# Patient Record
Sex: Male | Born: 1965 | Race: White | Hispanic: No | Marital: Married | Smoking: Never smoker
Health system: Southern US, Community
[De-identification: ages and names within clinical notes are randomized; demographics above are authoritative.]

## PROBLEM LIST (undated history)

## (undated) DIAGNOSIS — F902 Attention-deficit hyperactivity disorder, combined type: Secondary | ICD-10-CM

## (undated) DIAGNOSIS — M9250 Juvenile osteochondrosis of tibia and fibula, unspecified leg: Secondary | ICD-10-CM

## (undated) DIAGNOSIS — E7800 Pure hypercholesterolemia, unspecified: Secondary | ICD-10-CM

## (undated) DIAGNOSIS — R739 Hyperglycemia, unspecified: Secondary | ICD-10-CM

## (undated) DIAGNOSIS — I10 Essential (primary) hypertension: Secondary | ICD-10-CM

## (undated) DIAGNOSIS — E785 Hyperlipidemia, unspecified: Secondary | ICD-10-CM

## (undated) DIAGNOSIS — C4491 Basal cell carcinoma of skin, unspecified: Secondary | ICD-10-CM

## (undated) DIAGNOSIS — Z85828 Personal history of other malignant neoplasm of skin: Secondary | ICD-10-CM

## (undated) DIAGNOSIS — R079 Chest pain, unspecified: Secondary | ICD-10-CM

## (undated) DIAGNOSIS — M25519 Pain in unspecified shoulder: Secondary | ICD-10-CM

## (undated) DIAGNOSIS — K1379 Other lesions of oral mucosa: Secondary | ICD-10-CM

## (undated) DIAGNOSIS — S0993XA Unspecified injury of face, initial encounter: Secondary | ICD-10-CM

## (undated) DIAGNOSIS — M7918 Myalgia, other site: Secondary | ICD-10-CM

## (undated) DIAGNOSIS — G8929 Other chronic pain: Secondary | ICD-10-CM

## (undated) DIAGNOSIS — K219 Gastro-esophageal reflux disease without esophagitis: Secondary | ICD-10-CM

## (undated) DIAGNOSIS — IMO0002 Reserved for concepts with insufficient information to code with codable children: Secondary | ICD-10-CM

## (undated) DIAGNOSIS — M92529 Juvenile osteochondrosis of tibia tubercle, unspecified leg: Secondary | ICD-10-CM

## (undated) DIAGNOSIS — R229 Localized swelling, mass and lump, unspecified: Secondary | ICD-10-CM

## (undated) DIAGNOSIS — S6720XA Crushing injury of unspecified hand, initial encounter: Secondary | ICD-10-CM

## (undated) DIAGNOSIS — K625 Hemorrhage of anus and rectum: Secondary | ICD-10-CM

## (undated) DIAGNOSIS — J45909 Unspecified asthma, uncomplicated: Secondary | ICD-10-CM

## (undated) DIAGNOSIS — R51 Headache: Secondary | ICD-10-CM

## (undated) DIAGNOSIS — R519 Headache, unspecified: Secondary | ICD-10-CM

## (undated) DIAGNOSIS — G894 Chronic pain syndrome: Secondary | ICD-10-CM

## (undated) HISTORY — DX: Hyperglycemia, unspecified: R73.9

## (undated) HISTORY — DX: Juvenile osteochondrosis of tibia and fibula, unspecified leg: M92.50

## (undated) HISTORY — DX: Crushing injury of unspecified hand, initial encounter: S67.20XA

## (undated) HISTORY — DX: Myalgia, other site: M79.18

## (undated) HISTORY — DX: Basal cell carcinoma of skin, unspecified: C44.91

## (undated) HISTORY — DX: Other lesions of oral mucosa: K13.79

## (undated) HISTORY — DX: Chest pain, unspecified: R07.9

## (undated) HISTORY — PX: TONSILLECTOMY: SHX5217

## (undated) HISTORY — DX: Hemorrhage of anus and rectum: K62.5

## (undated) HISTORY — DX: Essential (primary) hypertension: I10

## (undated) HISTORY — DX: Attention-deficit hyperactivity disorder, combined type: F90.2

## (undated) HISTORY — DX: Localized swelling, mass and lump, unspecified: R22.9

## (undated) HISTORY — DX: Headache, unspecified: R51.9

## (undated) HISTORY — DX: Other chronic pain: G89.29

## (undated) HISTORY — DX: Unspecified injury of face, initial encounter: S09.93XA

## (undated) HISTORY — DX: Gastro-esophageal reflux disease without esophagitis: K21.9

## (undated) HISTORY — DX: Pain in unspecified shoulder: M25.519

## (undated) HISTORY — DX: Juvenile osteochondrosis of tibia tubercle, unspecified leg: M92.529

## (undated) HISTORY — DX: Pure hypercholesterolemia, unspecified: E78.00

## (undated) HISTORY — DX: Headache: R51

## (undated) HISTORY — DX: Unspecified asthma, uncomplicated: J45.909

## (undated) HISTORY — DX: Personal history of other malignant neoplasm of skin: Z85.828

## (undated) HISTORY — DX: Hyperlipidemia, unspecified: E78.5

## (undated) HISTORY — DX: Reserved for concepts with insufficient information to code with codable children: IMO0002

## (undated) HISTORY — DX: Chronic pain syndrome: G89.4

---

## 2002-02-01 DIAGNOSIS — C4491 Basal cell carcinoma of skin, unspecified: Secondary | ICD-10-CM

## 2002-02-01 HISTORY — PX: BASAL CELL CARCINOMA EXCISION: SHX1214

## 2002-02-01 HISTORY — DX: Basal cell carcinoma of skin, unspecified: C44.91

## 2004-01-17 ENCOUNTER — Ambulatory Visit (HOSPITAL_COMMUNITY): Admission: RE | Admit: 2004-01-17 | Discharge: 2004-01-17 | Payer: Self-pay | Admitting: Neurosurgery

## 2004-02-05 ENCOUNTER — Ambulatory Visit (HOSPITAL_COMMUNITY): Admission: RE | Admit: 2004-02-05 | Discharge: 2004-02-05 | Payer: Self-pay | Admitting: *Deleted

## 2005-05-02 HISTORY — PX: WRIST SURGERY: SHX841

## 2005-05-19 ENCOUNTER — Encounter (INDEPENDENT_AMBULATORY_CARE_PROVIDER_SITE_OTHER): Payer: Self-pay | Admitting: *Deleted

## 2005-05-19 ENCOUNTER — Ambulatory Visit (HOSPITAL_BASED_OUTPATIENT_CLINIC_OR_DEPARTMENT_OTHER): Admission: RE | Admit: 2005-05-19 | Discharge: 2005-05-19 | Payer: Self-pay | Admitting: Orthopedic Surgery

## 2005-09-13 ENCOUNTER — Encounter: Admission: RE | Admit: 2005-09-13 | Discharge: 2005-09-13 | Payer: Self-pay | Admitting: Family Medicine

## 2005-09-13 ENCOUNTER — Ambulatory Visit: Payer: Self-pay | Admitting: Family Medicine

## 2007-09-01 ENCOUNTER — Ambulatory Visit: Payer: Self-pay | Admitting: *Deleted

## 2007-09-01 ENCOUNTER — Ambulatory Visit (HOSPITAL_BASED_OUTPATIENT_CLINIC_OR_DEPARTMENT_OTHER): Admission: RE | Admit: 2007-09-01 | Discharge: 2007-09-01 | Payer: Self-pay | Admitting: *Deleted

## 2007-09-01 DIAGNOSIS — M79609 Pain in unspecified limb: Secondary | ICD-10-CM

## 2007-09-06 ENCOUNTER — Ambulatory Visit: Payer: Self-pay | Admitting: *Deleted

## 2007-09-06 DIAGNOSIS — E7800 Pure hypercholesterolemia, unspecified: Secondary | ICD-10-CM

## 2007-09-06 HISTORY — DX: Pure hypercholesterolemia, unspecified: E78.00

## 2007-09-08 ENCOUNTER — Ambulatory Visit: Payer: Self-pay | Admitting: *Deleted

## 2007-09-08 LAB — CONVERTED CEMR LAB
ALT: 18 units/L (ref 0–53)
AST: 18 units/L (ref 0–37)
Albumin: 4.8 g/dL (ref 3.5–5.2)
Alkaline Phosphatase: 48 units/L (ref 39–117)
BUN: 14 mg/dL (ref 6–23)
Basophils Absolute: 0 10*3/uL (ref 0.0–0.1)
Basophils Relative: 0 % (ref 0–1)
CO2: 20 meq/L (ref 19–32)
Calcium: 9.4 mg/dL (ref 8.4–10.5)
Chloride: 104 meq/L (ref 96–112)
Cholesterol: 214 mg/dL — ABNORMAL HIGH (ref 0–200)
Creatinine, Ser: 0.97 mg/dL (ref 0.40–1.50)
Eosinophils Absolute: 0 10*3/uL (ref 0.0–0.7)
Eosinophils Relative: 1 % (ref 0–5)
Glucose, Bld: 98 mg/dL (ref 70–99)
HCT: 43.4 % (ref 39.0–52.0)
HDL: 54 mg/dL (ref >39)
Hemoglobin: 14.9 g/dL (ref 13.0–17.0)
LDL Cholesterol: 148 mg/dL — ABNORMAL HIGH (ref 0–99)
Lymphocytes Relative: 35 % (ref 12–46)
Lymphs Abs: 1.5 10*3/uL (ref 0.7–4.0)
MCHC: 34.3 g/dL (ref 30.0–36.0)
MCV: 91.6 fL (ref 78.0–100.0)
Monocytes Absolute: 0.4 10*3/uL (ref 0.1–1.0)
Monocytes Relative: 10 % (ref 3–12)
Neutro Abs: 2.3 10*3/uL (ref 1.7–7.7)
Neutrophils Relative %: 54 % (ref 43–77)
Platelets: 256 10*3/uL (ref 150–400)
Potassium: 4.4 meq/L (ref 3.5–5.3)
RBC: 4.74 M/uL (ref 4.22–5.81)
RDW: 12.5 % (ref 11.5–15.5)
Sodium: 137 meq/L (ref 135–145)
TSH: 0.778 microintl units/mL (ref 0.350–4.50)
Total Bilirubin: 0.9 mg/dL (ref 0.3–1.2)
Total CHOL/HDL Ratio: 4
Total Protein: 7.3 g/dL (ref 6.0–8.3)
Triglycerides: 62 mg/dL (ref <150)
VLDL: 12 mg/dL (ref 0–40)
WBC: 4.3 10*3/uL (ref 4.0–10.5)

## 2007-10-20 ENCOUNTER — Encounter: Payer: Self-pay | Admitting: Internal Medicine

## 2007-10-27 ENCOUNTER — Encounter: Payer: Self-pay | Admitting: Internal Medicine

## 2008-01-03 ENCOUNTER — Ambulatory Visit: Payer: Self-pay | Admitting: *Deleted

## 2008-01-03 ENCOUNTER — Ambulatory Visit (HOSPITAL_BASED_OUTPATIENT_CLINIC_OR_DEPARTMENT_OTHER): Admission: RE | Admit: 2008-01-03 | Discharge: 2008-01-03 | Payer: Self-pay | Admitting: *Deleted

## 2008-01-03 ENCOUNTER — Ambulatory Visit: Payer: Self-pay | Admitting: Diagnostic Radiology

## 2008-01-03 DIAGNOSIS — R0602 Shortness of breath: Secondary | ICD-10-CM | POA: Insufficient documentation

## 2008-01-03 DIAGNOSIS — J45909 Unspecified asthma, uncomplicated: Secondary | ICD-10-CM

## 2008-01-03 HISTORY — DX: Unspecified asthma, uncomplicated: J45.909

## 2008-01-12 ENCOUNTER — Ambulatory Visit: Payer: Self-pay | Admitting: *Deleted

## 2008-03-04 ENCOUNTER — Telehealth (INDEPENDENT_AMBULATORY_CARE_PROVIDER_SITE_OTHER): Payer: Self-pay | Admitting: *Deleted

## 2008-03-04 ENCOUNTER — Ambulatory Visit: Payer: Self-pay | Admitting: Internal Medicine

## 2008-03-04 ENCOUNTER — Ambulatory Visit: Payer: Self-pay | Admitting: Radiology

## 2008-03-04 ENCOUNTER — Ambulatory Visit (HOSPITAL_BASED_OUTPATIENT_CLINIC_OR_DEPARTMENT_OTHER): Admission: RE | Admit: 2008-03-04 | Discharge: 2008-03-04 | Payer: Self-pay | Admitting: Internal Medicine

## 2008-03-05 ENCOUNTER — Encounter: Payer: Self-pay | Admitting: Internal Medicine

## 2008-10-28 ENCOUNTER — Encounter: Payer: Self-pay | Admitting: Internal Medicine

## 2010-02-18 ENCOUNTER — Encounter (INDEPENDENT_AMBULATORY_CARE_PROVIDER_SITE_OTHER): Payer: Self-pay | Admitting: *Deleted

## 2010-02-18 ENCOUNTER — Ambulatory Visit
Admission: RE | Admit: 2010-02-18 | Discharge: 2010-02-18 | Payer: Self-pay | Source: Home / Self Care | Attending: Family | Admitting: Family

## 2010-02-18 ENCOUNTER — Encounter: Payer: Self-pay | Admitting: Family

## 2010-02-18 DIAGNOSIS — Z9189 Other specified personal risk factors, not elsewhere classified: Secondary | ICD-10-CM | POA: Insufficient documentation

## 2010-02-18 DIAGNOSIS — M25519 Pain in unspecified shoulder: Secondary | ICD-10-CM

## 2010-02-18 DIAGNOSIS — Z85828 Personal history of other malignant neoplasm of skin: Secondary | ICD-10-CM

## 2010-02-18 HISTORY — DX: Pain in unspecified shoulder: M25.519

## 2010-02-18 HISTORY — DX: Personal history of other malignant neoplasm of skin: Z85.828

## 2010-02-18 LAB — CONVERTED CEMR LAB
AST: 18 units/L (ref 0–37)
Albumin: 5.3 g/dL — ABNORMAL HIGH (ref 3.5–5.2)
Alkaline Phosphatase: 56 units/L (ref 39–117)
BUN: 13 mg/dL (ref 6–23)
Basophils Absolute: 0 10*3/uL (ref 0.0–0.1)
Basophils Relative: 1 % (ref 0–1)
Bilirubin, Direct: 0.1 mg/dL (ref 0.0–0.3)
CO2: 27 meq/L (ref 19–32)
Calcium: 9.9 mg/dL (ref 8.4–10.5)
Chloride: 102 meq/L (ref 96–112)
Cholesterol: 228 mg/dL — ABNORMAL HIGH (ref 0–200)
Creatinine, Ser: 0.98 mg/dL (ref 0.40–1.50)
Eosinophils Relative: 1 % (ref 0–5)
Glucose, Bld: 98 mg/dL (ref 70–99)
HCT: 45.5 % (ref 39.0–52.0)
HDL: 54 mg/dL (ref >39)
Hemoglobin: 15.4 g/dL (ref 13.0–17.0)
Indirect Bilirubin: 0.6 mg/dL (ref 0.0–0.9)
Lymphs Abs: 1.4 10*3/uL (ref 0.7–4.0)
MCHC: 33.8 g/dL (ref 30.0–36.0)
MCV: 93.6 fL (ref 78.0–100.0)
Monocytes Absolute: 0.6 10*3/uL (ref 0.1–1.0)
Monocytes Relative: 11 % (ref 3–12)
Neutrophils Relative %: 61 % (ref 43–77)
Platelets: 285 10*3/uL (ref 150–400)
Potassium: 4.6 meq/L (ref 3.5–5.3)
RDW: 13 % (ref 11.5–15.5)
Sodium: 137 meq/L (ref 135–145)
TSH: 1.534 microintl units/mL (ref 0.350–4.500)
Total Bilirubin: 0.7 mg/dL (ref 0.3–1.2)
Total CHOL/HDL Ratio: 4.2
Triglycerides: 108 mg/dL (ref <150)
WBC: 5.2 10*3/uL (ref 4.0–10.5)

## 2010-02-20 ENCOUNTER — Encounter: Payer: Self-pay | Admitting: Family

## 2010-02-22 ENCOUNTER — Encounter: Payer: Self-pay | Admitting: Neurosurgery

## 2010-02-23 ENCOUNTER — Ambulatory Visit: Admit: 2010-02-23 | Payer: Self-pay | Admitting: Internal Medicine

## 2010-02-26 ENCOUNTER — Ambulatory Visit: Admission: RE | Admit: 2010-02-26 | Discharge: 2010-02-26 | Payer: Self-pay | Source: Home / Self Care

## 2010-03-03 NOTE — Miscellaneous (Signed)
Summary: Flu Shot/[facility]  Flu Shot/[facility]   Imported By: Lanelle Bal 02/12/2009 10:49:52  _____________________________________________________________________  External Attachment:    Type:   Image     Comment:   External Document

## 2010-03-05 NOTE — Letter (Signed)
Summary: Pre Visit Letter Revised  [facility]  [address] [place], [place] [address]   Phone: [phone]  Fax: [phone]        02/18/2010 MRN: [id number] Lawrence Mccarty [address] [place], [place]  [address]             Procedure Date:  March 10, 2010   dir col-Dr Abundio Miu to the Gastroenterology Division at [facility].    You are scheduled to see a nurse for your pre-procedure visit on February 23, 2010 at 1:00pm on the 3rd floor at [facility], [address].  We ask that you try to arrive at our office 15 minutes prior to your appointment time to allow for check-in.  Please take a minute to review the attached form.  If you answer "Yes" to one or more of the questions on the first page, we ask that you call the person listed at your earliest opportunity.  If you answer "No" to all of the questions, please complete the rest of the form and bring it to your appointment.    Your nurse visit will consist of discussing your medical and surgical history, your immediate family medical history, and your medications.   If you are unable to list all of your medications on the form, please bring the medication bottles to your appointment and we will list them.  We will need to be aware of both prescribed and over the counter drugs.  We will need to know exact dosage information as well.    Please be prepared to read and sign documents such as consent forms, a financial agreement, and acknowledgement forms.  If necessary, and with your consent, a friend or relative is welcome to sit-in on the nurse visit with you.  Please bring your insurance card so that we may make a copy of it.  If your insurance requires a referral to see a specialist, please bring your referral form from your primary care physician.  No co-pay is required for this nurse visit.     If you cannot keep your appointment, please call [phone] to cancel or reschedule prior  to your appointment date.  This allows [place] the opportunity to schedule an appointment for another patient in need of care.    Thank you for choosing [facility] for your medical needs.  We appreciate the opportunity to care for you.  Please visit [place] at our website  to learn more about our practice.  Sincerely, The Gastroenterology Division

## 2010-03-05 NOTE — Letter (Signed)
   [place] [facility] [address] [place], [place]  [address]  [place] Phone: [phone]      February 20, 2010   Lawrence Mccarty [address] [place], [place] [address]  RE:  LAB RESULTS  Dear  Mr. [name],  The following is an interpretation of your most recent lab tests.  Please take note of any instructions provided or changes to medications that have resulted from your lab work.  ELECTROLYTES:  Good - no changes needed  KIDNEY FUNCTION TESTS:  Good - no changes needed  LIVER FUNCTION TESTS:  Good - no changes needed  LIPID PANEL:  Fair - review at your next visit Triglyceride: 108   Cholesterol: 228   LDL: 152   HDL: 54   Chol/HDL%:  4.2 Ratio  THYROID STUDIES:  Thyroid studies normal TSH: 1.534     DIABETIC STUDIES:  Excellent - no changes needed Blood Glucose: 98    CBC:  Good - no changes needed  Your cholesterol is mildly elevated.  Please work hard on a low fat low cholesterol diet and excercise.  Please come fasting to your scheduled appointment in July and we will recheck your cholesterol to monitor your progress.   Sincerely Yours,    Lemont Fillers FNP  Appended Document:  mailed
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
## 2010-03-10 ENCOUNTER — Other Ambulatory Visit: Payer: Self-pay | Admitting: Internal Medicine

## 2010-03-26 ENCOUNTER — Encounter (INDEPENDENT_AMBULATORY_CARE_PROVIDER_SITE_OTHER): Payer: Self-pay | Admitting: *Deleted

## 2010-03-26 ENCOUNTER — Telehealth: Payer: Self-pay | Admitting: Internal Medicine

## 2010-03-31 NOTE — Progress Notes (Addendum)
Summary: need for colonoscopy  Phone Note Call from Patient   Summary of Call: Dr. Ellis Parents. Lawrence Mccarty is 45 years old--self referred for screening colonoscopy.  He felt that he needed a colonoscopy because his sister had colon polyps.  As far as he knows, she did not have colon CA.  The only GI issue that he is having is small amount of rectal bleeding intermittently; last time was 9 months ago.  Should he have colonoscopy or wait for screening at age 27? Initial call taken by: Ezra Sites RN,  March 26, 2010 1:19 PM  Follow-up for Phone Call        Its a gray zone. We can do it because of the history opf polyps but with only one polyp, etc may not really be needed. i will call him (rather than bringing in for visit) Follow-up by: Iva Boop MD, [name],  March 26, 2010 2:07 PM  Additional Follow-up for Phone Call Additional follow up Details #1::        I have not cancelled colonoscopy for 3/14.  If you decide he should proceed w/ colonoscopy as planned, he will need to make another appt. for PV. Additional Follow-up by: Ezra Sites RN,  March 26, 2010 2:39 PM    Additional Follow-up for Phone Call Additional follow up Details #2::    sister had a polyp or polyps at 47. He does not have details. He will call her to find out (if possible) if she had pre-cancerous polyps (may conclude if they told her to get repeat before 10 years) and then let us know results. will determine next step then. Follow-up by: Iva Boop MD, [name],  March 27, 2010 1:27 PM   Appended Document: need for colonoscopy Pt. has talked to sister.  Her recall is for 10 years.  I have cancelled his colonoscopy for 3/14 and told him that he will need screening colon at age 78.  Do you have any additional followup?  Appended Document: need for colonoscopy only lingering issue is the rectal bleeding - I did not discuss with him realize it has been rare but it would be best that he see me in office about  that so we can be certain it is not a significant problem  Appended Document: need for colonoscopy I talked w/ pt and made appointment for him to see you in office 4/25 at 2:45

## 2010-03-31 NOTE — Miscellaneous (Signed)
Summary: [facility] PV/need for colon  Clinical Lists Changes  Pt has family hx of colon polyps but not colon CA.  Will send note to Dr. Leone Payor to see if pt needs screening colon now.

## 2010-04-09 ENCOUNTER — Encounter (INDEPENDENT_AMBULATORY_CARE_PROVIDER_SITE_OTHER): Payer: Self-pay | Admitting: *Deleted

## 2010-04-14 NOTE — Letter (Signed)
Summary: New Patient letter  [facility]  [address] [place], [place] [address]   Phone: [phone]  Fax: [phone]       04/09/2010 MRN: [id number]  Lawrence Mccarty [address] [place], [place]  [address]  [place]  Dear Mr. [place] Bing,  Welcome to the Gastroenterology Division at [facility].    You are scheduled to see Dr.  Leone Payor on 05-27-10 at 2:45P.M. on the 3rd floor at [facility], [address].  We ask that you try to arrive at our office 15 minutes prior to your appointment time to allow for check-in.  We would like you to complete the enclosed self-administered evaluation form prior to your visit and bring it with you on the day of your appointment.  We will review it with you.  Also, please bring a complete list of all your medications or, if you prefer, bring the medication bottles and we will list them.  Please bring your insurance card so that we may make a copy of it.  If your insurance requires a referral to see a specialist, please bring your referral form from your primary care physician.  Co-payments are due at the time of your visit and may be paid by cash, check or credit card.     Your office visit will consist of a consult with your physician (includes a physical exam), any laboratory testing he/she may order, scheduling of any necessary diagnostic testing (e.g. x-ray, ultrasound, CT-scan), and scheduling of a procedure (e.g. Endoscopy, Colonoscopy) if required.  Please allow enough time on your schedule to allow for any/all of these possibilities.    If you cannot keep your appointment, please call [phone] to cancel or reschedule prior to your appointment date.  This allows [place] the opportunity to schedule an appointment for another patient in need of care.  If you do not cancel or reschedule by 5 p.m. the business day prior to your appointment date, you will be charged a $50.00 late cancellation/no-show fee.    Thank you for choosing  [facility] for your medical needs.  We appreciate the opportunity to care for you.  Please visit [place] at our website  to learn more about our practice.                     Sincerely,                                                             The Gastroenterology Division

## 2010-04-15 ENCOUNTER — Other Ambulatory Visit: Payer: Self-pay | Admitting: Internal Medicine

## 2010-05-27 ENCOUNTER — Ambulatory Visit: Payer: BC Managed Care – PPO | Admitting: Internal Medicine

## 2010-06-19 NOTE — Op Note (Signed)
NAME:  Reasons, Lawrence Mccarty                 ACCOUNT NO.:  [id number]   MEDICAL RECORD NO.:  [id number]          PATIENT TYPE:  AMB   LOCATION:  DSC                          FACILITY:  [facility]   PHYSICIAN:  Matthew A. Weingold, M.D.DATE OF BIRTH:  December 28, 1965   DATE OF PROCEDURE:  05/19/2005  DATE OF DISCHARGE:                                 OPERATIVE REPORT   PREOPERATIVE DIAGNOSIS:  Right wrist ulnar-sided mass.   POSTOPERATIVE DIAGNOSIS:  Right wrist ulnar-sided mass.   PROCEDURE:  Excisional biopsy of above.   SURGEON:  [name]. [name], M.D.   ANESTHESIA:  General.   TOURNIQUET TIME:  40 minutes.   COMPLICATIONS:  None.   DRAINS:  None.   OPERATIVE REPORT:  The patient was taken to the operating room.  With the  induction of adequate general anesthesia, right upper extremity was prepped  and draped in sterile fashion.  An Esmarch was used to exsanguinate the  limb.  Tourniquet was then inflated to 250 mmHg.  At this point in time, an  incision was made, 4 to 5 cm in length, over the ulnar border of the right  hand and wrist, just ulnar to the FCR tendon.  The skin was incised.  After  the fascia was incised, initially, we could see that the ECU muscle belly  was herniated out from underneath the tendon.  Careful dissection around the  tendon revealed a large cystic mass just under this herniated muscle.  At  this point in time, the ulnar neurovascular bundle was carefully identified  and retracted.  After this was done, a large cystic mass was seen coming  from what appeared to be the level of the distal radial ulnar joint and the  pronator musculature.  Careful dissection was carried down to the stalk.  This required ligation of some significant crossing vessels and the ulnar  artery to the distal radial ulnar joint and pronator quadratus area.  After  this was done, the neurovascular bundle was carefully retracted in the  midline and dissection was carried down to a stalk,  again, seen coming from  the distal radial ulnar nerve joint area.  The cyst was removed in its  entirety.  The wound was thoroughly irrigated.  Hemostasis was achieved with  bipolar cautery.  At this point in time, the wound was closed over a TLS  drain using a 3-0 Prolene subcuticular stitch.  Steri-Strips, 4 x 4s, fluff,  and a compressive dressing and ulnar gutter splint was applied.  The patient  tolerated the procedure well.      [name] [name], M.D.  Electronically Signed    MAW/MEDQ  D:  05/19/2005  T:  05/20/2005  Job:  [id number]

## 2010-07-08 ENCOUNTER — Ambulatory Visit (INDEPENDENT_AMBULATORY_CARE_PROVIDER_SITE_OTHER): Payer: BC Managed Care – PPO | Admitting: Internal Medicine

## 2010-07-08 ENCOUNTER — Encounter: Payer: Self-pay | Admitting: Internal Medicine

## 2010-07-08 VITALS — BP 118/64 | HR 70 | Ht 70.0 in | Wt 202.0 lb

## 2010-07-08 DIAGNOSIS — K625 Hemorrhage of anus and rectum: Secondary | ICD-10-CM

## 2010-07-08 HISTORY — DX: Hemorrhage of anus and rectum: K62.5

## 2010-07-08 MED ORDER — PEG-KCL-NACL-NASULF-NA ASC-C 100 G PO SOLR: 1.0000 | Freq: Once | ORAL | Status: DC

## 2010-07-08 NOTE — Patient Instructions (Signed)
You have been scheduled for a Colonoscopy with separate instructions given. Please pick up your prep kit from your pharmacy.

## 2010-07-08 NOTE — Assessment & Plan Note (Signed)
Chronic and intermittent. He is 45. It's most likely anorectal bleeding and hemorrhoidal but given the sister with the polyp and his age, not to mention the concern over his wife's friend a colonoscopy is appropriate. Risks benefits and indications are explained he understands and agrees to proceed

## 2010-07-08 NOTE — Progress Notes (Signed)
  Subjective:    Patient ID: Lawrence Mccarty, male    DOB: 12/23/1965, 45 y.o.   MRN: [id number]  HPI Comments: The pattern of his bleeding is not necessarily changed. He did have a sister who is 69 with a large benign polyp of the colon, this was removed recently and his wife's best friend was recently diagnosed with stage II colon cancer.  Rectal Bleeding  The current episode started more than 1 week ago. The onset was sudden (Chronic intermittent bleeding for years without obvious triggers. He will see on the toilet paper only. Generally bright red blood.). The problem occurs occasionally. The problem has been unchanged. The patient is experiencing no pain. The stool is described as soft. There was no prior successful therapy. There was no prior unsuccessful therapy. Pertinent negatives include no fever, no abdominal pain and no diarrhea.      Review of Systems  Constitutional: Negative for fever.  Gastrointestinal: Positive for hematochezia. Negative for abdominal pain and diarrhea.  All other systems reviewed and are negative.       Objective:   Physical Exam  Constitutional: He is oriented to person, place, and time. He appears well-developed and well-nourished.  Eyes: Conjunctivae are normal. No scleral icterus.  Neck: Neck supple. No thyromegaly present.  Cardiovascular: Normal rate, regular rhythm and normal heart sounds.  Exam reveals no gallop and no friction rub.   No murmur heard. Pulmonary/Chest: Effort normal and breath sounds normal.  Abdominal: Soft. Bowel sounds are normal. He exhibits no distension and no mass. There is no tenderness. There is no guarding.  Genitourinary: Penile tenderness: rectal deferred.       Rectal is deferred  Musculoskeletal: He exhibits no edema.  Lymphadenopathy:    He has no cervical adenopathy.  Neurological: He is alert and oriented to person, place, and time.          Assessment & Plan:

## 2010-07-14 ENCOUNTER — Encounter: Payer: Self-pay | Admitting: Internal Medicine

## 2010-07-14 ENCOUNTER — Ambulatory Visit (AMBULATORY_SURGERY_CENTER): Payer: BC Managed Care – PPO | Admitting: Internal Medicine

## 2010-07-14 VITALS — BP 119/77 | HR 67 | Temp 97.3°F | Resp 11 | Ht 70.0 in | Wt 202.0 lb

## 2010-07-14 DIAGNOSIS — K5730 Diverticulosis of large intestine without perforation or abscess without bleeding: Secondary | ICD-10-CM

## 2010-07-14 DIAGNOSIS — K625 Hemorrhage of anus and rectum: Secondary | ICD-10-CM

## 2010-07-14 DIAGNOSIS — K648 Other hemorrhoids: Secondary | ICD-10-CM

## 2010-07-14 DIAGNOSIS — Z8371 Family history of colonic polyps: Secondary | ICD-10-CM

## 2010-07-14 HISTORY — PX: COLONOSCOPY: SHX174

## 2010-07-14 MED ORDER — HYDROCORTISONE ACETATE 25 MG RE SUPP: RECTAL | Status: DC

## 2010-07-14 MED ORDER — SODIUM CHLORIDE 0.9 % IV SOLN: 500.0000 mL | INTRAVENOUS | Status: DC

## 2010-07-14 NOTE — Patient Instructions (Addendum)
The bleeding was from hemorrhoids. Anusol HC suppositories have been prescribed. You should get a copy of your sister's colonoscopy and pathology report. I can use that information to help better determine the timing of your next routine colonoscopy (? 5 vs. 10 years). Iva Boop, MD, [facility] Resume all medications. Information given for hemorrhoids and high fiber diet and diverticulosis.

## 2010-07-15 ENCOUNTER — Telehealth: Payer: Self-pay | Admitting: *Deleted

## 2010-07-15 NOTE — Telephone Encounter (Signed)

## 2010-08-04 ENCOUNTER — Encounter: Payer: Self-pay | Admitting: Family

## 2010-08-26 ENCOUNTER — Ambulatory Visit: Payer: Self-pay | Admitting: Family

## 2010-09-16 ENCOUNTER — Ambulatory Visit (INDEPENDENT_AMBULATORY_CARE_PROVIDER_SITE_OTHER): Payer: BC Managed Care – PPO | Admitting: Family

## 2010-09-16 ENCOUNTER — Encounter: Payer: Self-pay | Admitting: Family

## 2010-09-16 DIAGNOSIS — E7800 Pure hypercholesterolemia, unspecified: Secondary | ICD-10-CM

## 2010-09-16 DIAGNOSIS — Z9189 Other specified personal risk factors, not elsewhere classified: Secondary | ICD-10-CM

## 2010-09-16 DIAGNOSIS — M25519 Pain in unspecified shoulder: Secondary | ICD-10-CM

## 2010-09-16 DIAGNOSIS — Z85828 Personal history of other malignant neoplasm of skin: Secondary | ICD-10-CM

## 2010-09-16 NOTE — Assessment & Plan Note (Signed)
Will continue to work on diet and exercise.  Plan to recheck at his physical in January.

## 2010-09-16 NOTE — Progress Notes (Signed)
  Subjective:    Patient ID: Lawrence Mccarty, male    DOB: 05/04/1965, 45 y.o.   MRN: [id number]  HPI  Lawrence Mccarty is a 45 year old male who presents today for follow up.  1) Atypical chest pain- no symptoms for months, neg stress test 02/26/10  2) Family hx of polyps- had neg colo.  3) L shoulder pain-   Saw [facility]- recommended Rehab- pt declined as it was not bothering him that much.  Still has some trouble lifting the left shoulder.    4) Hx of Basal Cell carcinoma-  He is due for follow up.     Review of Systems See HPI  Past Medical History  Diagnosis Date  . Basal cell carcinoma 2004    nose  . Hyperlipidemia   . Chronic headaches   . Reactive airway disease   . Facial injury age 45    chain saw accident  . Osgood-Schlatter's disease     right knee  . Mass     right wrist ulnar    History   Social History  . Marital Status: Married    Spouse Name: N/A    Number of Children: 2  . Years of Education: N/A   Occupational History  . 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 Smoking status: Never Smoker   . Smokeless tobacco: Never Used  . Alcohol Use: 3.5 oz/week    7 drink(s) per week     beer, wine  . Drug Use: No  . Sexually Active: Not on file   Other Topics Concern  . Not on file   Social History Narrative   Regular exercise:   No    Past Surgical History  Procedure Date  . Basal cell carcinoma excision 2004    nose  . Tonsillectomy   . Wrist surgery 05/2005    ulnar mass removed  . Colonoscopy 07/14/10    diverticulosis and internal hemorrhoids    Family History  Problem Relation Age of Onset  . Hyperlipidemia Father     mother  . Hypertension Father   . Colon polyps Sister   . Hyperlipidemia Mother     No Known Allergies  No current outpatient prescriptions on file prior to visit.   Current Facility-Administered Medications on File Prior to Visit  Medication Dose Route Frequency Provider Last Rate Last Dose  . 0.9 %  sodium  chloride infusion  500 mL Intravenous Continuous Iva Boop, MD        BP 130/82  Pulse 90  Temp(Src) 98 F (36.7 C) (Oral)  Ht 5\' 10"  (1.778 m)  Wt 199 lb 1.9 oz (90.32 kg)  BMI 28.57 kg/m2  SpO2 97%       Objective:   Physical Exam  Constitutional: He appears well-developed and well-nourished.  HENT:  Head: Normocephalic and atraumatic.  Eyes: Conjunctivae are normal. Pupils are equal, round, and reactive to light.  Cardiovascular: Normal rate and regular rhythm.   No murmur heard. Pulmonary/Chest: Effort normal and breath sounds normal.  Musculoskeletal: He exhibits no edema.  Psychiatric: He has a normal mood and affect. His behavior is normal. Judgment and thought content normal.          Assessment & Plan:

## 2010-09-16 NOTE — Patient Instructions (Signed)
Please follow up in 6 months for a complete physical (after 1/18). Come fasting to this appointment.

## 2010-09-16 NOTE — Assessment & Plan Note (Signed)
Pt was instructed to arrange a follow up apt with Dr. Danella Deis.  He tells me he will do so.

## 2010-09-16 NOTE — Assessment & Plan Note (Signed)
Resolved, stress test negative.

## 2010-09-16 NOTE — Assessment & Plan Note (Signed)
Unchanged, he does not wish to pursue further therapy or work up at this time.

## 2011-09-22 ENCOUNTER — Ambulatory Visit (INDEPENDENT_AMBULATORY_CARE_PROVIDER_SITE_OTHER): Payer: BC Managed Care – PPO | Admitting: Family

## 2011-09-22 ENCOUNTER — Encounter: Payer: Self-pay | Admitting: Family

## 2011-09-22 VITALS — BP 112/76 | HR 73 | Temp 98.6°F | Resp 16 | Wt 205.0 lb

## 2011-09-22 DIAGNOSIS — K1370 Unspecified lesions of oral mucosa: Secondary | ICD-10-CM

## 2011-09-22 DIAGNOSIS — K121 Other forms of stomatitis: Secondary | ICD-10-CM

## 2011-09-22 DIAGNOSIS — K1379 Other lesions of oral mucosa: Secondary | ICD-10-CM | POA: Insufficient documentation

## 2011-09-22 HISTORY — DX: Other lesions of oral mucosa: K13.79

## 2011-09-22 NOTE — Progress Notes (Signed)
  Subjective:    Patient ID: Lawrence Mccarty, male    DOB: [birth year], 46 y.o.   MRN: [id number]  HPI  Mr.  Lawrence Mccarty is a 46 yr old male who presents today to discuss a mouth sore. Sore has been present x 2 months and is located on the right inner cheek adjacent to his wisdom tooth.  He reports that the area is sore and is enlarging.  He has not tried any otc meds for this. Dentist told pt that he should have his wistom tooth extraced and he has scheduled wistom tooth extraction in 2 weeks.  Pt reports that he is very worried about this and "won't sleep until he gets it looked at."   Review of Systems See HPI  Past Medical History  Diagnosis Date  . Basal cell carcinoma 2004    nose  . Hyperlipidemia   . Chronic headaches   . Reactive airway disease   . Facial injury age 20    chain saw accident  . Osgood-Schlatter's disease     right knee  . Mass     right wrist ulnar    History   Social History  . Marital Status: Married    Spouse Name: N/A    Number of Children: 2  . Years of Education: N/A   Occupational History  . 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 Smoking status: Never Smoker   . Smokeless tobacco: Never Used  . Alcohol Use: 3.5 oz/week    7 drink(s) per week     beer, wine  . Drug Use: No  . Sexually Active: Not on file   Other Topics Concern  . Not on file   Social History Narrative   Regular exercise:   No    Past Surgical History  Procedure Date  . Basal cell carcinoma excision 2004    nose  . Tonsillectomy   . Wrist surgery 05/2005    ulnar mass removed  . Colonoscopy 07/14/10    diverticulosis and internal hemorrhoids    Family History  Problem Relation Age of Onset  . Hyperlipidemia Father     mother  . Hypertension Father   . Colon polyps Sister   . Hyperlipidemia Mother     No Known Allergies  No current outpatient prescriptions on file prior to visit.   Current Facility-Administered Medications on File Prior to Visit    Medication Dose Route Frequency Provider Last Rate Last Dose  . 0.9 %  sodium chloride infusion  500 mL Intravenous Continuous Iva Boop, MD        BP 112/76  Pulse 73  Temp 98.6 F (37 C) (Oral)  Resp 16  Wt 205 lb (92.987 kg)  SpO2 98%       Objective:   Physical Exam  Constitutional: He appears well-developed and well-nourished. No distress.  HENT:  Head: Normocephalic and atraumatic.       Ulcer noted right posterior inner cheek adjacent to top wisdom tooth.    Psychiatric: He has a normal mood and affect. His behavior is normal. Judgment and thought content normal.          Assessment & Plan:

## 2011-09-22 NOTE — Assessment & Plan Note (Signed)
Recommended that pt proceed with dental extraction as it is possible that the wisdom tooth is irritating this area.  However due to pt's concern and duration of lesion, will also refer to ENT for further evaluation and possible biopsy.  Pt is agreeable to proceed with this referral.

## 2011-09-22 NOTE — Patient Instructions (Addendum)
You will be contact about your referral ent. Please let us know if you have not heard back within 1 week about your referral. Please schedule a fasting physical.

## 2011-09-29 ENCOUNTER — Encounter: Payer: BC Managed Care – PPO | Admitting: Family

## 2012-03-19 ENCOUNTER — Encounter (HOSPITAL_BASED_OUTPATIENT_CLINIC_OR_DEPARTMENT_OTHER): Payer: Self-pay | Admitting: *Deleted

## 2012-03-19 ENCOUNTER — Emergency Department (HOSPITAL_BASED_OUTPATIENT_CLINIC_OR_DEPARTMENT_OTHER): Payer: BC Managed Care – PPO

## 2012-03-19 ENCOUNTER — Emergency Department (HOSPITAL_BASED_OUTPATIENT_CLINIC_OR_DEPARTMENT_OTHER)
Admission: EM | Admit: 2012-03-19 | Discharge: 2012-03-19 | Disposition: A | Discharge status: Home / Self Care | Payer: BC Managed Care – PPO | Attending: Emergency Medicine | Admitting: Emergency Medicine

## 2012-03-19 DIAGNOSIS — Z862 Personal history of diseases of the blood and blood-forming organs and certain disorders involving the immune mechanism: Secondary | ICD-10-CM | POA: Insufficient documentation

## 2012-03-19 DIAGNOSIS — Z8522 Personal history of malignant neoplasm of nasal cavities, middle ear, and accessory sinuses: Secondary | ICD-10-CM | POA: Insufficient documentation

## 2012-03-19 DIAGNOSIS — S92919B Unspecified fracture of unspecified toe(s), initial encounter for open fracture: Secondary | ICD-10-CM

## 2012-03-19 DIAGNOSIS — W208XXA Other cause of strike by thrown, projected or falling object, initial encounter: Secondary | ICD-10-CM | POA: Insufficient documentation

## 2012-03-19 DIAGNOSIS — Y939 Activity, unspecified: Secondary | ICD-10-CM | POA: Insufficient documentation

## 2012-03-19 DIAGNOSIS — S92919A Unspecified fracture of unspecified toe(s), initial encounter for closed fracture: Secondary | ICD-10-CM | POA: Insufficient documentation

## 2012-03-19 DIAGNOSIS — Z8639 Personal history of other endocrine, nutritional and metabolic disease: Secondary | ICD-10-CM | POA: Insufficient documentation

## 2012-03-19 DIAGNOSIS — Y929 Unspecified place or not applicable: Secondary | ICD-10-CM | POA: Insufficient documentation

## 2012-03-19 DIAGNOSIS — Z87828 Personal history of other (healed) physical injury and trauma: Secondary | ICD-10-CM | POA: Insufficient documentation

## 2012-03-19 DIAGNOSIS — R51 Headache: Secondary | ICD-10-CM | POA: Insufficient documentation

## 2012-03-19 DIAGNOSIS — Z8739 Personal history of other diseases of the musculoskeletal system and connective tissue: Secondary | ICD-10-CM | POA: Insufficient documentation

## 2012-03-19 DIAGNOSIS — J45909 Unspecified asthma, uncomplicated: Secondary | ICD-10-CM | POA: Insufficient documentation

## 2012-03-19 MED ORDER — CEPHALEXIN 500 MG PO CAPS: 500.0000 mg | ORAL_CAPSULE | Freq: Four times a day (QID) | ORAL | Status: DC

## 2012-03-19 MED ORDER — HYDROMORPHONE HCL PF 2 MG/ML IJ SOLN
2.0000 mg | Freq: Once | INTRAMUSCULAR | Status: AC
  Administered 2012-03-19: 2 mg via INTRAMUSCULAR
  Filled 2012-03-19: qty 1

## 2012-03-19 MED ORDER — OXYCODONE-ACETAMINOPHEN 5-325 MG PO TABS: 2.0000 | ORAL_TABLET | ORAL | Status: DC | PRN

## 2012-03-19 MED ORDER — TETANUS-DIPHTH-ACELL PERTUSSIS 5-2.5-18.5 LF-MCG/0.5 IM SUSP
0.5000 mL | Freq: Once | INTRAMUSCULAR | Status: AC
  Administered 2012-03-19: 0.5 mL via INTRAMUSCULAR
  Filled 2012-03-19: qty 0.5

## 2012-03-19 MED ORDER — ONDANSETRON HCL 4 MG/2ML IJ SOLN
4.0000 mg | Freq: Once | INTRAMUSCULAR | Status: AC
  Administered 2012-03-19: 4 mg via INTRAMUSCULAR
  Filled 2012-03-19: qty 2

## 2012-03-19 NOTE — ED Provider Notes (Signed)
Medical screening examination/treatment/procedure(s) were performed by non-physician practitioner and as supervising physician I was immediately available for consultation/collaboration.  Javeon Macmurray K Linker, MD 03/19/12 1845 

## 2012-03-19 NOTE — ED Provider Notes (Signed)
History     CSN: [id number]  Arrival date & time 03/19/12  1602   First MD Initiated Contact with Patient 03/19/12 1614      Chief Complaint  Patient presents with  . Toe Injury    (Consider location/radiation/quality/duration/timing/severity/associated sxs/prior treatment) Patient is a 47 y.o. male presenting with foot injury. The history is provided by the patient. No language interpreter was used.  Foot Injury Location:  Foot Time since incident:  3 hours Injury: yes   Foot location:  R foot Pain details:    Severity:  Severe   Onset quality:  Sudden   Timing:  Constant Pt reports he dropped a boat motor on his foot.  Pt complains of pain to right 3rd, 4th and 5th toes.   Laceration above 3rd toe.    Past Medical History  Diagnosis Date  . Basal cell carcinoma 2004    nose  . Hyperlipidemia   . Chronic headaches   . Reactive airway disease   . Facial injury age 81    chain saw accident  . Osgood-Schlatter's disease     right knee  . Mass     right wrist ulnar    Past Surgical History  Procedure Laterality Date  . Basal cell carcinoma excision  2004    nose  . Tonsillectomy    . Wrist surgery  05/2005    ulnar mass removed  . Colonoscopy  07/14/10    diverticulosis and internal hemorrhoids    Family History  Problem Relation Age of Onset  . Hyperlipidemia Father     mother  . Hypertension Father   . Colon polyps Sister   . Hyperlipidemia Mother     History  Substance Use Topics  . Smoking status: Never Smoker   . Smokeless tobacco: Never Used  . Alcohol Use: 3.5 oz/week    7 drink(s) per week     Comment: beer, wine      Review of Systems  Musculoskeletal: Positive for joint swelling.  Skin: Positive for wound.  All other systems reviewed and are negative.    Allergies  Review of patient's allergies indicates no known allergies.  Home Medications  No current outpatient prescriptions on file.  BP 100/56  Pulse 65  Temp(Src) 98 F  (36.7 C) (Oral)  Resp 16  Ht 5\' 10"  (1.778 m)  Wt 200 lb (90.719 kg)  BMI 28.7 kg/m2  SpO2 100%  Physical Exam  Nursing note reviewed. Constitutional: He appears well-developed and well-nourished.  HENT:  Head: Normocephalic and atraumatic.  Cardiovascular: Normal rate.   Pulmonary/Chest: Effort normal.  Musculoskeletal: He exhibits tenderness.  Neurological: He is alert.  Skin: Skin is warm.  7mm laceration base of 3rd toe,  Small skin flap, bruised swollen 3rd, 4th and 5th toe  Psychiatric: He has a normal mood and affect.    ED Course  Procedures (including critical care time)  Labs Reviewed - No data to display Dg Foot Complete Right  03/19/2012  *RADIOLOGY REPORT*  Clinical Data: Right foot injury.  Toe pain.  RIGHT FOOT COMPLETE - 3+ VIEW  Comparison: None.  Findings: There is a comminuted fracture of the middle phalanx of the right small toe.  Oblique fracture of the terminal phalanx of the right small toe.  The fourth toe shows a transverse fracture of the distal aspect of the proximal phalanx which is minimally displaced.  The third toe also shows a transverse fracture of the distal aspect of the proximal phalanx  which is mildly displaced laterally.  The only intra-articular fracture is the comminuted fracture of the middle phalanx of the small toe, with the base of the middle phalanx essentially pulverized.  IMPRESSION: Mildly displaced third, fourth, and fifth toe fractures described above.   Original Report Authenticated By: Andreas Newport, M.D.      1. Closed fracture of one or more phalanges of foot   2. Open fracture of one or more phalanges of foot       MDM  Keflex and percocet,  Tetanus given.   Pt advised to call Dr. Dion Saucier tomorrow to be seen for recheck.  Ice and elevate.   Pt placed in a post op shoe        Lonia Skinner [place], [place] 03/19/12 1731  Lonia Skinner [place], [place] 03/19/12 [phone]

## 2012-03-19 NOTE — ED Notes (Signed)
Pt states he dropped a 100 pound blade onto his right foot, injuring his last 3 toes. Clydie Braun, [place] called to triage. Feels touch. Bandage applied. Orders given.

## 2012-11-24 ENCOUNTER — Ambulatory Visit (INDEPENDENT_AMBULATORY_CARE_PROVIDER_SITE_OTHER): Payer: BC Managed Care – PPO | Admitting: Family

## 2012-11-24 ENCOUNTER — Encounter: Payer: Self-pay | Admitting: Family

## 2012-11-24 VITALS — BP 120/80 | HR 64 | Temp 97.8°F | Resp 16 | Ht 70.0 in | Wt 204.1 lb

## 2012-11-24 DIAGNOSIS — K219 Gastro-esophageal reflux disease without esophagitis: Secondary | ICD-10-CM

## 2012-11-24 HISTORY — DX: Gastro-esophageal reflux disease without esophagitis: K21.9

## 2012-11-24 MED ORDER — OMEPRAZOLE 40 MG PO CPDR: 40.0000 mg | DELAYED_RELEASE_CAPSULE | Freq: Every day | ORAL | Status: DC

## 2012-11-24 NOTE — Patient Instructions (Signed)
Please start omeprazole. Call if symptoms worsen. Follow up in 1 month.    Diet for Gastroesophageal Reflux Disease, Adult Reflux (acid reflux) is when acid from your stomach flows up into the esophagus. When acid comes in contact with the esophagus, the acid causes irritation and soreness (inflammation) in the esophagus. When reflux happens often or so severely that it causes damage to the esophagus, it is called gastroesophageal reflux disease (GERD). Nutrition therapy can help ease the discomfort of GERD. FOODS OR DRINKS TO AVOID OR LIMIT  Smoking or chewing tobacco. Nicotine is one of the most potent stimulants to acid production in the gastrointestinal tract.  Caffeinated and decaffeinated coffee and black tea.  Regular or low-calorie carbonated beverages or energy drinks (caffeine-free carbonated beverages are allowed).   Strong spices, such as black pepper, white pepper, red pepper, cayenne, curry powder, and chili powder.  Peppermint or spearmint.  Chocolate.  High-fat foods, including meats and fried foods. Extra added fats including oils, butter, salad dressings, and nuts. Limit these to less than 8 tsp per day.  Fruits and vegetables if they are not tolerated, such as citrus fruits or tomatoes.  Alcohol.  Any food that seems to aggravate your condition. If you have questions regarding your diet, call your caregiver or a registered dietitian. OTHER THINGS THAT MAY HELP GERD INCLUDE:   Eating your meals slowly, in a relaxed setting.  Eating 5 to 6 small meals per day instead of 3 large meals.  Eliminating food for a period of time if it causes distress.  Not lying down until 3 hours after eating a meal.  Keeping the head of your bed raised 6 to 9 inches (15 to 23 cm) by using a foam wedge or blocks under the legs of the bed. Lying flat may make symptoms worse.  Being physically active. Weight loss may be helpful in reducing reflux in overweight or obese  adults.  Wear loose fitting clothing EXAMPLE MEAL PLAN This meal plan is approximately 2,000 calories based on https://www.[facility].org/ meal planning guidelines. Breakfast   cup cooked oatmeal.  1 cup strawberries.  1 cup low-fat milk.  1 oz almonds. Snack  1 cup cucumber slices.  6 oz yogurt (made from low-fat or fat-free milk). Lunch  2 slice whole-wheat bread.  2 oz sliced [place].  2 tsp mayonnaise.  1 cup blueberries.  1 cup snap peas. Snack  6 whole-wheat crackers.  1 oz string cheese. Dinner   cup brown rice.  1 cup mixed veggies.  1 tsp olive oil.  3 oz grilled fish. Document Released: 01/18/2005 Document Revised: 04/12/2011 Document Reviewed: 12/04/2010 [facility] Patient Information 2014 [place], [place].

## 2012-11-24 NOTE — Progress Notes (Signed)
  Subjective:    Patient ID: Lawrence Mccarty, male    DOB: [birth year], 47 y.o.   MRN: [id number]  HPI  Lawrence Mccarty is a 47 yr old male who presents today with chief complaint of dyphagia. Started about 1 week ago.  Reports ok to drink water/liquids.  + discomfort with swallowing food.  Denies feeling like food gets stuck. Denies cough, denies vomitting.   Reports that in the past he has had gerd symptoms.  Not currently on PPI.    Review of Systems See HPI  Past Medical History  Diagnosis Date  . Basal cell carcinoma 2004    nose  . Hyperlipidemia   . Chronic headaches   . Reactive airway disease   . Facial injury age 70    chain saw accident  . Osgood-Schlatter's disease     right knee  . Mass     right wrist ulnar    History   Social History  . Marital Status: Married    Spouse Name: N/A    Number of Children: 2  . Years of Education: N/A   Occupational History  . 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 Smoking status: Never Smoker   . Smokeless tobacco: Never Used  . Alcohol Use: 3.5 oz/week    7 drink(s) per week     Comment: beer, wine  . Drug Use: No  . Sexual Activity: Not on file   Other Topics Concern  . Not on file   Social History Narrative   Regular exercise:   No          Past Surgical History  Procedure Laterality Date  . Basal cell carcinoma excision  2004    nose  . Tonsillectomy    . Wrist surgery  05/2005    ulnar mass removed  . Colonoscopy  07/14/10    diverticulosis and internal hemorrhoids    Family History  Problem Relation Age of Onset  . Hyperlipidemia Father     mother  . Hypertension Father   . Colon polyps Sister   . Hyperlipidemia Mother     No Known Allergies  No current outpatient prescriptions on file prior to visit.   No current facility-administered medications on file prior to visit.    BP 120/80  Pulse 64  Temp(Src) 97.8 F (36.6 C) (Oral)  Resp 16  Ht 5\' 10"  (1.778 m)  Wt 204 lb 1.9 oz (92.588  kg)  BMI 29.29 kg/m2  SpO2 98%        Objective:   Physical Exam  Constitutional: He is oriented to person, place, and time. He appears well-developed and well-nourished. No distress.  HENT:  Head: Normocephalic and atraumatic.  Cardiovascular: Normal rate and regular rhythm.   No murmur heard. Pulmonary/Chest: Effort normal and breath sounds normal. No respiratory distress. He has no wheezes. He has no rales. He exhibits no tenderness.  Abdominal: Soft. Bowel sounds are normal. He exhibits no distension and no mass. There is no tenderness. There is no rebound and no guarding.  Musculoskeletal: He exhibits no edema.  Mild tenderness to palpation overlying xyphoid process  Neurological: He is alert and oriented to person, place, and time.  Psychiatric: He has a normal mood and affect. His behavior is normal. Judgment and thought content normal.          Assessment & Plan:

## 2012-11-24 NOTE — Assessment & Plan Note (Addendum)
Deteriorated. Suspect esophagitis related to uncontrolled gerd.  Trial of omeprazole 40mg . Follow up in 1 month. Plan referral to GI if not improved.  Discussed GERD diet.

## 2014-01-21 ENCOUNTER — Encounter: Payer: Self-pay | Admitting: Cardiology

## 2014-08-30 ENCOUNTER — Ambulatory Visit (INDEPENDENT_AMBULATORY_CARE_PROVIDER_SITE_OTHER): Payer: Managed Care, Other (non HMO) | Admitting: Physician Assistant

## 2014-08-30 ENCOUNTER — Encounter: Payer: Self-pay | Admitting: Physician Assistant

## 2014-08-30 ENCOUNTER — Telehealth: Payer: Self-pay | Admitting: *Deleted

## 2014-08-30 ENCOUNTER — Ambulatory Visit (HOSPITAL_BASED_OUTPATIENT_CLINIC_OR_DEPARTMENT_OTHER)
Admission: RE | Admit: 2014-08-30 | Discharge: 2014-08-30 | Disposition: A | Discharge status: Home / Self Care | Payer: Managed Care, Other (non HMO) | Source: Ambulatory Visit | Attending: Physician Assistant | Admitting: Physician Assistant

## 2014-08-30 VITALS — BP 130/78 | HR 71 | Temp 97.9°F | Ht 70.0 in | Wt 213.8 lb

## 2014-08-30 DIAGNOSIS — S6722XA Crushing injury of left hand, initial encounter: Secondary | ICD-10-CM | POA: Insufficient documentation

## 2014-08-30 DIAGNOSIS — R079 Chest pain, unspecified: Secondary | ICD-10-CM | POA: Insufficient documentation

## 2014-08-30 DIAGNOSIS — M7989 Other specified soft tissue disorders: Secondary | ICD-10-CM | POA: Insufficient documentation

## 2014-08-30 DIAGNOSIS — X58XXXA Exposure to other specified factors, initial encounter: Secondary | ICD-10-CM | POA: Insufficient documentation

## 2014-08-30 LAB — CBC
HEMATOCRIT: 41.3 % (ref 39.0–52.0)
HEMOGLOBIN: 14.3 g/dL (ref 13.0–17.0)
MCHC: 34.7 g/dL (ref 30.0–36.0)
MCV: 92.4 fl (ref 78.0–100.0)
Platelets: 285 10*3/uL (ref 150.0–400.0)
RBC: 4.47 Mil/uL (ref 4.22–5.81)
RDW: 12.7 % (ref 11.5–15.5)
WBC: 6.4 10*3/uL (ref 4.0–10.5)

## 2014-08-30 LAB — D-DIMER, QUANTITATIVE: D-Dimer, Quant: 0.39 ug/mL-FEU (ref 0.00–0.48)

## 2014-08-30 MED ORDER — HYDROCODONE-ACETAMINOPHEN 10-325 MG PO TABS: 1.0000 | ORAL_TABLET | Freq: Three times a day (TID) | ORAL | Status: DC | PRN

## 2014-08-30 NOTE — Telephone Encounter (Signed)
Thank you for making me aware.

## 2014-08-30 NOTE — Patient Instructions (Signed)
Please go to the lab for blood work then proceed downstairs for imaging. I will call you with all of your results. We will likely be sending you to Orthopedics today for a fracture but will wait on results. Continue bracing. Take Norco as needed for pain.   If anything acutely worsens or you develop shortness of breath of lightheadedness, please call 911 or go to the ER.

## 2014-08-30 NOTE — Progress Notes (Signed)
Pre visit review using our clinic review tool, if applicable. No additional management support is needed unless otherwise documented below in the visit note. 

## 2014-08-30 NOTE — Telephone Encounter (Signed)
Received call from [facility] with [facility] to report D-dimer from today of 0.39 (within normal range).

## 2014-08-31 DIAGNOSIS — R079 Chest pain, unspecified: Secondary | ICD-10-CM | POA: Insufficient documentation

## 2014-08-31 DIAGNOSIS — S6720XA Crushing injury of unspecified hand, initial encounter: Secondary | ICD-10-CM | POA: Insufficient documentation

## 2014-08-31 HISTORY — DX: Crushing injury of unspecified hand, initial encounter: S67.20XA

## 2014-08-31 HISTORY — DX: Chest pain, unspecified: R07.9

## 2014-08-31 NOTE — Progress Notes (Signed)
Patient presents to clinic today c/o intermittent R-sided chest discomfort over the past month that feels deep and is described as aching pain. Does not radiate. Patient denies pleuritic chest pain, shortness of breath, palpitations, lightheadedness or dizziness. Patient denies smoking history or occupational exposures. Denies recent surgery, prolonged immobilization. Denies hx hypercoagulable state or prior DVT/PE.  Denies heart burn or acid reflux. Patient states pain is not exacerbated by movement or lifting.  Patient also notes pain and swelling of L hand after crush injury by car motor. Endorses bruising and pain but denies decreased ROM, numbness or tingling. Endorses motor was > 100 lbs.  Past Medical History  Diagnosis Date  . Basal cell carcinoma 2004    nose  . Hyperlipidemia   . Chronic headaches   . Reactive airway disease   . Facial injury age 39    chain saw accident  . Osgood-Schlatter's disease     right knee  . Mass     right wrist ulnar    No current outpatient prescriptions on file prior to visit.   No current facility-administered medications on file prior to visit.    No Known Allergies  Family History  Problem Relation Age of Onset  . Hyperlipidemia Father     mother  . Hypertension Father   . Colon polyps Sister   . Hyperlipidemia Mother     History   Social History  . Marital Status: Married    Spouse Name: N/A  . Number of Children: 2  . Years of Education: N/A   Occupational History  . 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 Smoking status: Never Smoker   . Smokeless tobacco: Never Used  . Alcohol Use: 3.5 oz/week    7 drink(s) per week     Comment: beer, wine  . Drug Use: No  . Sexual Activity: Not on file   Other Topics Concern  . None   Social History Narrative   Regular exercise:   No         Review of Systems - See HPI.  All other ROS are negative.  BP 130/78 mmHg  Pulse 71  Temp(Src) 97.9 F (36.6 C)  (Oral)  Ht 5\' 10"  (1.778 m)  Wt 213 lb 12.8 oz (96.979 kg)  BMI 30.68 kg/m2  SpO2 98%  Physical Exam  Constitutional: He is oriented to person, place, and time and well-developed, well-nourished, and in no distress.  HENT:  Head: Normocephalic and atraumatic.  Eyes: Conjunctivae are normal. Pupils are equal, round, and reactive to light.  Neck: Neck supple.  Cardiovascular: Normal rate, regular rhythm, normal heart sounds and intact distal pulses.   Pulmonary/Chest: Effort normal and breath sounds normal. No respiratory distress. He has no wheezes. He has no rales. He exhibits no tenderness.  Abdominal: Soft. Bowel sounds are normal. He exhibits no distension and no mass. There is no tenderness. There is no rebound and no guarding.  Musculoskeletal:       Left wrist: Normal.       Left hand: He exhibits tenderness and swelling. He exhibits normal range of motion, no bony tenderness and normal capillary refill. Normal sensation noted. Normal strength noted.  Neurological: He is alert and oriented to person, place, and time.  Skin: Skin is warm and dry. No rash noted.  Psychiatric: Affect normal.  Vitals reviewed.   Recent Results (from the past 2160 hour(s))  CBC     Status: None   Collection  Time: 08/30/14 11:02 AM  Result Value Ref Range   WBC 6.4 4.0 - 10.5 K/uL   RBC 4.47 4.22 - 5.81 Mil/uL   Platelets 285.0 150.0 - 400.0 K/uL   Hemoglobin 14.3 13.0 - 17.0 g/dL   HCT 41.3 39.0 - 52.0 %   MCV 92.4 78.0 - 100.0 fl   MCHC 34.7 30.0 - 36.0 g/dL   RDW 12.7 11.5 - 15.5 %  D-Dimer, Quantitative     Status: None   Collection Time: 08/30/14 11:02 AM  Result Value Ref Range   D-Dimer, Quant 0.39 0.00 - 0.48 ug/mL-FEU    Comment: At the inhouse established cutoff value of 0.48 ug/mL FEU, this methology has been documented in the literature to have a sensitivity and negative predictive value of at least 98-99%.  The test result should be correlated with an assessment of the clinical  probability of DVT/VTE.     Assessment/Plan: Hand crush injury Significant concern of fracture giving crush injury, swelling and extent of bruising. X-ray left hand/wrist will be obtained. Bracing applied. RICE discussed. Rx Norco for pain. Will send to Ortho based on results.  Right-sided chest pain EKG reveals sinus bradycardia. Cardiac/Lung examination unremarkable. STAT CBC, D-Dimer negative. CXR also obtained. Will treat based on results. Avoid heavy lifting or overexertion. Could potentially be deep musculature but need CXR to assess for infection, mass etc. Alarm signs/symptoms discussed with patient. He is aware of when 911 is necessary.

## 2014-08-31 NOTE — Assessment & Plan Note (Signed)
EKG reveals sinus bradycardia. Cardiac/Lung examination unremarkable. STAT CBC, D-Dimer negative. CXR also obtained. Will treat based on results. Avoid heavy lifting or overexertion. Could potentially be deep musculature but need CXR to assess for infection, mass etc. Alarm signs/symptoms discussed with patient. He is aware of when 911 is necessary.

## 2014-08-31 NOTE — Assessment & Plan Note (Signed)
Significant concern of fracture giving crush injury, swelling and extent of bruising. X-ray left hand/wrist will be obtained. Bracing applied. RICE discussed. Rx Norco for pain. Will send to Ortho based on results.

## 2014-09-18 ENCOUNTER — Encounter: Payer: Self-pay | Admitting: Medical

## 2014-09-18 ENCOUNTER — Ambulatory Visit (INDEPENDENT_AMBULATORY_CARE_PROVIDER_SITE_OTHER): Payer: Managed Care, Other (non HMO) | Admitting: Medical

## 2014-09-18 VITALS — BP 116/80 | HR 68 | Temp 98.0°F | Resp 16 | Ht 70.0 in | Wt 210.0 lb

## 2014-09-18 DIAGNOSIS — M940 Chondrocostal junction syndrome [Tietze]: Secondary | ICD-10-CM

## 2014-09-18 DIAGNOSIS — K219 Gastro-esophageal reflux disease without esophagitis: Secondary | ICD-10-CM | POA: Diagnosis not present

## 2014-09-18 DIAGNOSIS — R0789 Other chest pain: Secondary | ICD-10-CM | POA: Diagnosis not present

## 2014-09-18 MED ORDER — GI COCKTAIL ~~LOC~~: 30.0000 mL | Freq: Once | ORAL | Status: DC

## 2014-09-18 MED ORDER — KETOROLAC TROMETHAMINE 60 MG/2ML IM SOLN: 60.0000 mg | Freq: Once | INTRAMUSCULAR | Status: AC

## 2014-09-18 MED ORDER — RANITIDINE HCL 150 MG PO CAPS: 150.0000 mg | ORAL_CAPSULE | Freq: Two times a day (BID) | ORAL | Status: DC

## 2014-09-18 NOTE — Progress Notes (Signed)
Subjective:    Patient ID: Lawrence Mccarty, male    DOB: [birth year], 49 y.o.   MRN: [id number]  HPI  Pt in states he has some rt sided chest pain. Pt had ekg done last time. Ekg showed bradcardia. D-dimer was negative. Cbc was normal. Pt thought maybe muscle pain. 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 Pt states some chest wall pain with movement of thorax even now. Note no shoulder pain, no arm pain. No jaw pain or sweating reported.  He also notes feature of recently when he swallows he feels burning sensation in lower esophagus region. Pt was on omeprazole in the past 3 years ago. Pt had egd in the past about 3 years ago. Used omeprazole only briefly. I don't see egd but did see colonoscopy. Last 2 wks restarted omeprazole 40 mg a day.  Pt has no fh mi or stroke. NO hyperlipidemia. No diabetes. NO htn meds. Pt does not smoke. No recent lipids but he was given best rating from insurance company.    Review of Systems  Constitutional: Negative for fever, chills and fatigue.  Respiratory: Negative for cough, chest tightness, shortness of breath and wheezing.   Cardiovascular: Negative for palpitations.       Atypical chest pain.  Gastrointestinal: Positive for abdominal pain. Negative for nausea, vomiting, constipation, abdominal distention and anal bleeding.  Musculoskeletal: Negative for back pain.  Neurological: Negative for dizziness and headaches.  Hematological: Negative for adenopathy. Does not bruise/bleed easily.  Psychiatric/Behavioral: Negative for behavioral problems and confusion.    Past Medical History  Diagnosis Date  . Basal cell carcinoma 2004    nose  . Hyperlipidemia   . Chronic headaches   . Reactive airway disease   . Facial injury age 72    chain saw accident  . Osgood-Schlatter's disease     right knee  . Mass     right wrist ulnar    Social History   Social History  . Marital Status: Married    Spouse Name: N/A  . Number of Children: 2  .  Years of Education: N/A   Occupational History  . 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 Smoking status: Never Smoker   . Smokeless tobacco: Never Used  . Alcohol Use: 3.5 oz/week    7 drink(s) per week     Comment: beer, wine  . Drug Use: No  . Sexual Activity: Not on file   Other Topics Concern  . Not on file   Social History Narrative   Regular exercise:   No          Past Surgical History  Procedure Laterality Date  . Basal cell carcinoma excision  2004    nose  . Tonsillectomy    . Wrist surgery  05/2005    ulnar mass removed  . Colonoscopy  07/14/10    diverticulosis and internal hemorrhoids    Family History  Problem Relation Age of Onset  . Hyperlipidemia Father     mother  . Hypertension Father   . Colon polyps Sister   . Hyperlipidemia Mother     No Known Allergies  No current outpatient prescriptions on file prior to visit.   No current facility-administered medications on file prior to visit.    BP 116/80 mmHg  Pulse 68  Temp(Src) 98 F (36.7 C) (Oral)  Resp 16  Ht 5\' 10"  (1.778 m)  Wt 210 lb (95.255 kg)  BMI 30.13 kg/m2  SpO2 98%       Objective:   Physical Exam  General- No acute distress. Pleasant patient. Neck- Full range of motion, no jvd Lungs- Clear, even and unlabored. Heart- regular rate and rhythm. Neurologic- CNII- XII grossly intact. Anterior chest wall- on palpation of both costochandral junction mild reproducible pain on palpation.   Abdomen Inspection:-Inspection Normal.  Palpation/Perucssion: Palpation and Percussion of the abdomen reveal- Non Tender, No Rebound tenderness, No rigidity(Guarding) and No Palpable abdominal masses.  Liver:-Normal.  Spleen:- Normal.        Assessment & Plan:  You have atypical chest pain vs costochondritis. We did ekg today and no major changes compared to last ekg.  We gave toradol 60 mg im today for costochondritis.  For you heart burn like symptoms continue  omeprazole but I am adding ranitidine rx. (also GI cocktail given in office as well).  I want you to update me by this  Friday how you are feeling. I am considering referring you to cardiologist for stress test. If you have any severe type discomfort at any time then ED evaluation.  Also I would ask you go ahead and schedule complete physical exam in next week so we can start assessing if cardiac risk factors present(labs to include fasting lipid panel)  Ekg show bradycardia mild on review.

## 2014-09-18 NOTE — Patient Instructions (Addendum)
You have atypical chest pain vs costochondritis. We did ekg today and no major changes compared to last ekg.  We gave toradol 60 mg im today for costochondritis.  For you heart burn like symptoms continue omeprazole but I am adding ranitidine rx. (also GI cocktail given in office as well).  I want you to update me by this  Friday how you are feeling. I am considering referring you to cardiologist for stress test. If you have any severe type discomfort at any time then ED evaluation.  Also I would ask you go ahead and schedule complete physical exam in next week so we can start assessing if cardiac risk factors present(labs to include fasting lipid panel)

## 2014-09-18 NOTE — Progress Notes (Signed)
Pre visit review using our clinic review tool, if applicable. No additional management support is needed unless otherwise documented below in the visit note. 

## 2014-09-18 NOTE — Addendum Note (Signed)
Addended by: Tasia Catchings on: 09/18/2014 03:08 PM   Modules accepted: Orders

## 2014-09-19 ENCOUNTER — Telehealth: Payer: Self-pay | Admitting: Family

## 2014-09-19 NOTE — Telephone Encounter (Signed)
pre visit letter mailed 09/05/14 °

## 2014-09-23 ENCOUNTER — Telehealth: Payer: Self-pay | Admitting: *Deleted

## 2014-09-23 ENCOUNTER — Encounter: Payer: Self-pay | Admitting: *Deleted

## 2014-09-23 NOTE — Telephone Encounter (Signed)
Physical was canceled and [facility] to 09/25/14. Pre visit letter mailed 09/23/14

## 2014-09-23 NOTE — Telephone Encounter (Signed)
Patient returning your call best # [phone]

## 2014-09-23 NOTE — Telephone Encounter (Signed)
Pre-Visit Call completed with patient and chart updated.   Pre-Visit Info documented in Specialty Comments under [employer].    

## 2014-09-23 NOTE — Telephone Encounter (Signed)
Unable to reach patient at time of Pre-Visit Call.  Left message for patient to return call when available.    

## 2014-09-23 NOTE — Addendum Note (Signed)
Addended by: Leticia Penna A on: 09/23/2014 04:51 PM   Modules accepted: Medications

## 2014-09-25 ENCOUNTER — Encounter: Payer: Self-pay | Admitting: Medical

## 2014-09-25 ENCOUNTER — Ambulatory Visit (HOSPITAL_BASED_OUTPATIENT_CLINIC_OR_DEPARTMENT_OTHER)
Admission: RE | Admit: 2014-09-25 | Discharge: 2014-09-25 | Disposition: A | Discharge status: Home / Self Care | Payer: Managed Care, Other (non HMO) | Source: Ambulatory Visit | Attending: Medical | Admitting: Medical

## 2014-09-25 ENCOUNTER — Ambulatory Visit (INDEPENDENT_AMBULATORY_CARE_PROVIDER_SITE_OTHER): Payer: Managed Care, Other (non HMO) | Admitting: Medical

## 2014-09-25 ENCOUNTER — Telehealth: Payer: Self-pay | Admitting: Medical

## 2014-09-25 VITALS — BP 118/80 | HR 56 | Temp 97.9°F | Ht 70.0 in | Wt 206.8 lb

## 2014-09-25 DIAGNOSIS — Z0000 Encounter for general adult medical examination without abnormal findings: Secondary | ICD-10-CM | POA: Diagnosis not present

## 2014-09-25 DIAGNOSIS — R079 Chest pain, unspecified: Secondary | ICD-10-CM | POA: Diagnosis not present

## 2014-09-25 DIAGNOSIS — R0789 Other chest pain: Secondary | ICD-10-CM

## 2014-09-25 DIAGNOSIS — R12 Heartburn: Secondary | ICD-10-CM | POA: Insufficient documentation

## 2014-09-25 DIAGNOSIS — E785 Hyperlipidemia, unspecified: Secondary | ICD-10-CM

## 2014-09-25 LAB — LIPID PANEL
CHOLESTEROL: 218 mg/dL — AB (ref 0–200)
HDL: 53 mg/dL (ref >39.00)
LDL CALC: 148 mg/dL — AB (ref 0–99)
NonHDL: 165.25
TRIGLYCERIDES: 88 mg/dL (ref 0.0–149.0)
Total CHOL/HDL Ratio: 4
VLDL: 17.6 mg/dL (ref 0.0–40.0)

## 2014-09-25 LAB — CBC WITH DIFFERENTIAL/PLATELET
BASOS PCT: 0.6 % (ref 0.0–3.0)
Basophils Absolute: 0 10*3/uL (ref 0.0–0.1)
EOS ABS: 0.2 10*3/uL (ref 0.0–0.7)
EOS PCT: 3 % (ref 0.0–5.0)
HEMATOCRIT: 42.2 % (ref 39.0–52.0)
HEMOGLOBIN: 14.5 g/dL (ref 13.0–17.0)
LYMPHS PCT: 26.5 % (ref 12.0–46.0)
Lymphs Abs: 1.5 10*3/uL (ref 0.7–4.0)
MCHC: 34.3 g/dL (ref 30.0–36.0)
MCV: 91.3 fl (ref 78.0–100.0)
Monocytes Absolute: 0.4 10*3/uL (ref 0.1–1.0)
Monocytes Relative: 7.5 % (ref 3.0–12.0)
NEUTROS ABS: 3.5 10*3/uL (ref 1.4–7.7)
Neutrophils Relative %: 62.4 % (ref 43.0–77.0)
PLATELETS: 282 10*3/uL (ref 150.0–400.0)
RBC: 4.62 Mil/uL (ref 4.22–5.81)
RDW: 12.9 % (ref 11.5–15.5)
WBC: 5.6 10*3/uL (ref 4.0–10.5)

## 2014-09-25 LAB — COMPREHENSIVE METABOLIC PANEL
ALBUMIN: 4.5 g/dL (ref 3.5–5.2)
ALT: 18 U/L (ref 0–53)
AST: 16 U/L (ref 0–37)
Alkaline Phosphatase: 48 U/L (ref 39–117)
BUN: 14 mg/dL (ref 6–23)
CALCIUM: 9.6 mg/dL (ref 8.4–10.5)
CHLORIDE: 103 meq/L (ref 96–112)
CO2: 28 meq/L (ref 19–32)
CREATININE: 0.9 mg/dL (ref 0.40–1.50)
GFR: 95.56 mL/min (ref >60.00)
Glucose, Bld: 111 mg/dL — ABNORMAL HIGH (ref 70–99)
POTASSIUM: 4.3 meq/L (ref 3.5–5.1)
Sodium: 138 mEq/L (ref 135–145)
Total Bilirubin: 0.6 mg/dL (ref 0.2–1.2)
Total Protein: 7.2 g/dL (ref 6.0–8.3)

## 2014-09-25 LAB — POCT URINALYSIS DIPSTICK
BILIRUBIN UA: NEGATIVE
Blood, UA: NEGATIVE
GLUCOSE UA: NEGATIVE
KETONES UA: NEGATIVE
LEUKOCYTES UA: NEGATIVE
PROTEIN UA: NEGATIVE
SPEC GRAV UA: 1.02
Urobilinogen, UA: NEGATIVE
pH, UA: 7.5

## 2014-09-25 LAB — TSH: TSH: 1.18 u[IU]/mL (ref 0.35–4.50)

## 2014-09-25 MED ORDER — ATORVASTATIN CALCIUM 10 MG PO TABS: 10.0000 mg | ORAL_TABLET | Freq: Every day | ORAL | Status: DC

## 2014-09-25 NOTE — Assessment & Plan Note (Signed)
Will get cbc, cmp, tsh, lipid panel, and ua.  Get flu vaccine at work. Pt states hiv screening negative with life insurance.

## 2014-09-25 NOTE — Progress Notes (Signed)
Subjective:    Patient ID: Lawrence Mccarty, male    DOB: 1966-01-26, 49 y.o.   MRN: [id number]  HPI  I have reviewed pt PMH, PSH, FH, Social History and Surgical History  Pt is fasting. Here for physical.  Tdap is up to date.   Pt will get fluvaccine at [facility]. He will update [place] when he gets so we can update in computer.  No family history of prostate cancer. No urine obstuction described on review.  Pt does not exercise officially. Pt tries to avoid fast foods. Pt drinks 2 glasses of wine 2-3 times a week. Couple of beers a week.   Review of Systems  Constitutional: Negative for fever, chills and fatigue.  Respiratory: Negative for cough, chest tightness, shortness of breath and wheezing.   Cardiovascular: Negative for chest pain and palpitations.  Gastrointestinal: Negative for nausea, abdominal pain, diarrhea and abdominal distention.       For 2 days after GI cocktail stomach felt a lot better.  Symptoms of gerd came back but he started zantac 4 days ago and that feels improved.  Genitourinary: Negative for dysuria, urgency, frequency, hematuria, flank pain, difficulty urinating and testicular pain.  Musculoskeletal: Negative for back pain.       Anterior chest wall also felt much improved after I gave toradol. Felt a lot better for 3 days. Then faint recoccurence. Just above xyphoid process. And level of discomfort lower lt level 1. No associated cardiac type symptoms.  Neurological: Negative for dizziness, syncope, weakness, numbness and headaches.  Hematological: Positive for adenopathy. Does not bruise/bleed easily.  Psychiatric/Behavioral: Negative for behavioral problems and confusion.    Past Medical History  Diagnosis Date  . Basal cell carcinoma 2004    nose  . Hyperlipidemia   . Chronic headaches   . Reactive airway disease   . Facial injury age 53    chain saw accident  . Osgood-Schlatter's disease     right knee  . Mass     right wrist ulnar     Social History   Social History  . Marital Status: Married    Spouse Name: N/A  . Number of Children: 2  . Years of Education: N/A   Occupational History  . 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 Smoking status: Never Smoker   . Smokeless tobacco: Never Used  . Alcohol Use: 3.5 oz/week    7 Standard drinks or equivalent per week     Comment: beer, wine  . Drug Use: No  . Sexual Activity: Not on file   Other Topics Concern  . Not on file   Social History Narrative   Regular exercise:   No          Past Surgical History  Procedure Laterality Date  . Basal cell carcinoma excision  2004    nose  . Tonsillectomy    . Wrist surgery  05/2005    ulnar mass removed  . Colonoscopy  07/14/10    diverticulosis and internal hemorrhoids    Family History  Problem Relation Age of Onset  . Hyperlipidemia Father     mother  . Hypertension Father   . Colon polyps Sister   . Hyperlipidemia Mother     No Known Allergies  Current Outpatient Prescriptions on File Prior to Visit  Medication Sig Dispense Refill  . ranitidine (ZANTAC) 150 MG capsule Take 1 capsule (150 mg total) by mouth 2 (two) times daily. 60 capsule  0   No current facility-administered medications on file prior to visit.    BP 118/80 mmHg  Pulse 56  Temp(Src) 97.9 F (36.6 C) (Oral)  Ht 5\' 10"  (1.778 m)  Wt 206 lb 12.8 oz (93.804 kg)  BMI 29.67 kg/m2  SpO2 97%       Objective:   Physical Exam  General Mental Status- Alert. Orientation- Oriented x3.  Build and Nutrition- Well nourished and Well Developed.  Skin General:-Normal. Color- Normal color. Moisture- Normal. Temperature-Warm.  HEENT  Ears- Normal. Auditory Canal- Bilateral-Normal. Tympanic Membrane- Bilateral-Normal. Eye Fundi-Bilateral-Normal. Pupil- bilateral- Direct reaction to light normal. Nose & Sinuses- Normal. Nostrils-Bilateral- Normal. Mouth & Throat-Normal.  Neck Neck- No Bruits or Masses. Trachea  midline.  Thyroid- Normal.  Chest and Lung Exam Percussion: Quality and Intensity-Percussion normal. Percussion of the chest reveals- No Dullness.  Palpation: Palpation of the chest reveals- Non-tender- No dullness. Auscultation: Breath Sounds- Normal.  Adventitous Sounds:-No adventitious sounds.  Cardiovascular Inspection:- No Heaves. Auscultation:-Normal sinus rhythm without murmur gallop, S1 WNL and S2 WNL.  Abdomen Inspection:-Inspection Normal. Inspection of the abdomen reveals- No hernias Palpation/Percussion:- Palpation and Percussion of the Abdomen reveal- Non Tender and No Palpable abdominal masses. Liver: Other Characteristics- No hepatomegaly. Spleen:Other Characteristics- No Splenomegaly. Auscultation:- Auscultation of the abdomen reveals- Bowel sounds normal and No Abdominal bruits.  Male Genitourinary Urethra:- No discharge. Penis- Circumcised. Scrotum- No masses. Testes- Bilateral-Normal.  Rectal Anorectal Exam: Performed- Normal sphincter tone. No masses noted. Prostate smooth normal size. Stool HEME Negative.  Peripheral  Vascular Lower Extremity:Inspection- Bilateral-Inspection Normal    Neurologic Mental Status:- Normal. Cranial Nerves:-Normal Bilaterally. Motor:-Normal. Strength:5/5 normal muscle strength-All Muscles.  Normal.  Meningeal Signs- None.  Musculoskeletal Global Assessment General-Joints show full range of motion without obvious deformity and Normal muscle mass. Strength in upper and lower extremities.  Lymphatics General lymphatics Description- No generalized lymphadenopathy.       Assessment & Plan:

## 2014-09-25 NOTE — Patient Instructions (Addendum)
Wellness examination Will get cbc, cmp, tsh, lipid panel, and ua.  Get flu vaccine at work. Pt states hiv screening negative with life insurance.    Will follow fasting labs. I am still considering referal to cardiologist for vague low level epigastric/lower sternum region pain. If any significant discomfort pending labs then ED evaluation.  Follow up 1 yr physical exam or as needed Preventive Care for Adults A healthy lifestyle and preventive care can promote health and wellness. Preventive health guidelines for men include the following key practices:  A routine yearly physical is a good way to check with your health care provider about your health and preventative screening. It is a chance to share any concerns and updates on your health and to receive a thorough exam.  Visit your dentist for a routine exam and preventative care every 6 months. Brush your teeth twice a day and floss once a day. Good oral hygiene prevents tooth decay and gum disease.  The frequency of eye exams is based on your age, health, family medical history, use of contact lenses, and other factors. Follow your health care provider's recommendations for frequency of eye exams.  Eat a healthy diet. Foods such as vegetables, fruits, whole grains, low-fat dairy products, and lean protein foods contain the nutrients you need without too many calories. Decrease your intake of foods high in solid fats, added sugars, and salt. Eat the right amount of calories for you.Get information about a proper diet from your health care provider, if necessary.  Regular physical exercise is one of the most important things you can do for your health. Most adults should get at least 150 minutes of moderate-intensity exercise (any activity that increases your heart rate and causes you to sweat) each week. In addition, most adults need muscle-strengthening exercises on 2 or more days a week.  Maintain a healthy weight. The body mass index  (BMI) is a screening tool to identify possible weight problems. It provides an estimate of body fat based on height and weight. Your health care provider can find your BMI and can help you achieve or maintain a healthy weight.For adults 20 years and older:  A BMI below 18.5 is considered underweight.  A BMI of 18.5 to 24.9 is normal.  A BMI of 25 to 29.9 is considered overweight.  A BMI of 30 and above is considered obese.  Maintain normal blood lipids and cholesterol levels by exercising and minimizing your intake of saturated fat. Eat a balanced diet with plenty of fruit and vegetables. Blood tests for lipids and cholesterol should begin at age 26 and be repeated every 5 years. If your lipid or cholesterol levels are high, you are over 50, or you are at high risk for heart disease, you may need your cholesterol levels checked more frequently.Ongoing high lipid and cholesterol levels should be treated with medicines if diet and exercise are not working.  If you smoke, find out from your health care provider how to quit. If you do not use tobacco, do not start.  Lung cancer screening is recommended for adults aged 69-80 years who are at high risk for developing lung cancer because of a history of smoking. A yearly low-dose CT scan of the lungs is recommended for people who have at least a 30-pack-year history of smoking and are a current smoker or have quit within the past 15 years. A pack year of smoking is smoking an average of 1 pack of cigarettes a day for 1  year (for example: 1 pack a day for 30 years or 2 packs a day for 15 years). Yearly screening should continue until the smoker has stopped smoking for at least 15 years. Yearly screening should be stopped for people who develop a health problem that would prevent them from having lung cancer treatment.  If you choose to drink alcohol, do not have more than 2 drinks per day. One drink is considered to be 12 ounces (355 mL) of beer, 5 ounces  (148 mL) of wine, or 1.5 ounces (44 mL) of liquor.  Avoid use of street drugs. Do not share needles with anyone. Ask for help if you need support or instructions about stopping the use of drugs.  High blood pressure causes heart disease and increases the risk of stroke. Your blood pressure should be checked at least every 1-2 years. Ongoing high blood pressure should be treated with medicines, if weight loss and exercise are not effective.  If you are 55-48 years old, ask your health care provider if you should take aspirin to prevent heart disease.  Diabetes screening involves taking a blood sample to check your fasting blood sugar level. This should be done once every 3 years, after age 64, if you are within normal weight and without risk factors for diabetes. Testing should be considered at a younger age or be carried out more frequently if you are overweight and have at least 1 risk factor for diabetes.  Colorectal cancer can be detected and often prevented. Most routine colorectal cancer screening begins at the age of 39 and continues through age 28. However, your health care provider may recommend screening at an earlier age if you have risk factors for colon cancer. On a yearly basis, your health care provider may provide home test kits to check for hidden blood in the stool. Use of a small camera at the end of a tube to directly examine the colon (sigmoidoscopy or colonoscopy) can detect the earliest forms of colorectal cancer. Talk to your health care provider about this at age 38, when routine screening begins. Direct exam of the colon should be repeated every 5-10 years through age 37, unless early forms of precancerous polyps or small growths are found.  People who are at an increased risk for hepatitis B should be screened for this virus. You are considered at high risk for hepatitis B if:  You were born in a country where hepatitis B occurs often. Talk with your health care provider about  which countries are considered high risk.  Your parents were born in a high-risk country and you have not received a shot to protect against hepatitis B (hepatitis B vaccine).  You have HIV or AIDS.  You use needles to inject street drugs.  You live with, or have sex with, someone who has hepatitis B.  You are a man who has sex with other men (MSM).  You get hemodialysis treatment.  You take certain medicines for conditions such as cancer, organ transplantation, and autoimmune conditions.  Hepatitis C blood testing is recommended for all people born from [birth year] through 1965 and any individual with known risks for hepatitis C.  Practice safe sex. Use condoms and avoid high-risk sexual practices to reduce the spread of sexually transmitted infections (STIs). STIs include gonorrhea, chlamydia, syphilis, trichomonas, herpes, HPV, and human immunodeficiency virus (HIV). Herpes, HIV, and HPV are viral illnesses that have no cure. They can result in disability, cancer, and death.  If you are at  risk of being infected with HIV, it is recommended that you take a prescription medicine daily to prevent HIV infection. This is called preexposure prophylaxis (PrEP). You are considered at risk if:  You are a man who has sex with other men (MSM) and have other risk factors.  You are a heterosexual man, are sexually active, and are at increased risk for HIV infection.  You take drugs by injection.  You are sexually active with a partner who has HIV.  Talk with your health care provider about whether you are at high risk of being infected with HIV. If you choose to begin PrEP, you should first be tested for HIV. You should then be tested every 3 months for as long as you are taking PrEP.  A one-time screening for abdominal aortic aneurysm (AAA) and surgical repair of large AAAs by ultrasound are recommended for men ages 68 to 70 years who are current or former smokers.  Healthy men should no longer  receive prostate-specific antigen (PSA) blood tests as part of routine cancer screening. Talk with your health care provider about prostate cancer screening.  Testicular cancer screening is not recommended for adult males who have no symptoms. Screening includes self-exam, a health care provider exam, and other screening tests. Consult with your health care provider about any symptoms you have or any concerns you have about testicular cancer.  Use sunscreen. Apply sunscreen liberally and repeatedly throughout the day. You should seek shade when your shadow is shorter than you. Protect yourself by wearing long sleeves, pants, a wide-brimmed hat, and sunglasses year round, whenever you are outdoors.  Once a month, do a whole-body skin exam, using a mirror to look at the skin on your back. Tell your health care provider about new moles, moles that have irregular borders, moles that are larger than a pencil eraser, or moles that have changed in shape or color.  Stay current with required vaccines (immunizations).  Influenza vaccine. All adults should be immunized every year.  Tetanus, diphtheria, and acellular pertussis (Td, Tdap) vaccine. An adult who has not previously received Tdap or who does not know his vaccine status should receive 1 dose of Tdap. This initial dose should be followed by tetanus and diphtheria toxoids (Td) booster doses every 10 years. Adults with an unknown or incomplete history of completing a 3-dose immunization series with Td-containing vaccines should begin or complete a primary immunization series including a Tdap dose. Adults should receive a Td booster every 10 years.  Varicella vaccine. An adult without evidence of immunity to varicella should receive 2 doses or a second dose if he has previously received 1 dose.  Human papillomavirus (HPV) vaccine. Males aged 28-21 years who have not received the vaccine previously should receive the 3-dose series. Males aged 22-26 years  may be immunized. Immunization is recommended through the age of 80 years for any male who has sex with males and did not get any or all doses earlier. Immunization is recommended for any person with an immunocompromised condition through the age of 55 years if he did not get any or all doses earlier. During the 3-dose series, the second dose should be obtained 4-8 weeks after the first dose. The third dose should be obtained 24 weeks after the first dose and 16 weeks after the second dose.  Zoster vaccine. One dose is recommended for adults aged 54 years or older unless certain conditions are present.  Measles, mumps, and rubella (MMR) vaccine. Adults born before  1956 generally are considered immune to measles and mumps. Adults born in [birth year] or later should have 1 or more doses of MMR vaccine unless there is a contraindication to the vaccine or there is laboratory evidence of immunity to each of the three diseases. A routine second dose of MMR vaccine should be obtained at least 28 days after the first dose for students attending postsecondary schools, health care workers, or international travelers. People who received inactivated measles vaccine or an unknown type of measles vaccine during 1963-1967 should receive 2 doses of MMR vaccine. People who received inactivated mumps vaccine or an unknown type of mumps vaccine before 1979 and are at high risk for mumps infection should consider immunization with 2 doses of MMR vaccine. Unvaccinated health care workers born before [birth year] who lack laboratory evidence of measles, mumps, or rubella immunity or laboratory confirmation of disease should consider measles and mumps immunization with 2 doses of MMR vaccine or rubella immunization with 1 dose of MMR vaccine.  Pneumococcal 13-valent conjugate (PCV13) vaccine. When indicated, a person who is uncertain of his immunization history and has no record of immunization should receive the PCV13 vaccine. An adult aged 32  years or older who has certain medical conditions and has not been previously immunized should receive 1 dose of PCV13 vaccine. This PCV13 should be followed with a dose of pneumococcal polysaccharide (PPSV23) vaccine. The PPSV23 vaccine dose should be obtained at least 8 weeks after the dose of PCV13 vaccine. An adult aged 20 years or older who has certain medical conditions and previously received 1 or more doses of PPSV23 vaccine should receive 1 dose of PCV13. The PCV13 vaccine dose should be obtained 1 or more years after the last PPSV23 vaccine dose.  Pneumococcal polysaccharide (PPSV23) vaccine. When PCV13 is also indicated, PCV13 should be obtained first. All adults aged 26 years and older should be immunized. An adult younger than age 26 years who has certain medical conditions should be immunized. Any person who resides in a nursing home or long-term care facility should be immunized. An adult smoker should be immunized. People with an immunocompromised condition and certain other conditions should receive both PCV13 and PPSV23 vaccines. People with human immunodeficiency virus (HIV) infection should be immunized as soon as possible after diagnosis. Immunization during chemotherapy or radiation therapy should be avoided. Routine use of PPSV23 vaccine is not recommended for American Indians, [place] Natives, or people younger than 65 years unless there are medical conditions that require PPSV23 vaccine. When indicated, people who have unknown immunization and have no record of immunization should receive PPSV23 vaccine. One-time revaccination 5 years after the first dose of PPSV23 is recommended for people aged 19-64 years who have chronic kidney failure, nephrotic syndrome, asplenia, or immunocompromised conditions. People who received 1-2 doses of PPSV23 before age 78 years should receive another dose of PPSV23 vaccine at age 43 years or later if at least 5 years have passed since the previous dose.  Doses of PPSV23 are not needed for people immunized with PPSV23 at or after age 10 years.  Meningococcal vaccine. Adults with asplenia or persistent complement component deficiencies should receive 2 doses of quadrivalent meningococcal conjugate (MenACWY-D) vaccine. The doses should be obtained at least 2 months apart. Microbiologists working with certain meningococcal bacteria, [place] recruits, people at risk during an outbreak, and people who travel to or live in countries with a high rate of meningitis should be immunized. A first-year college student up through age 66 years  who is living in a residence hall should receive a dose if he did not receive a dose on or after his 16th birthday. Adults who have certain high-risk conditions should receive one or more doses of vaccine.  Hepatitis A vaccine. Adults who wish to be protected from this disease, have certain high-risk conditions, work with hepatitis A-infected animals, work in hepatitis A research labs, or travel to or work in countries with a high rate of hepatitis A should be immunized. Adults who were previously unvaccinated and who anticipate close contact with an international adoptee during the first 60 days after arrival in the [place] from a country with a high rate of hepatitis A should be immunized.  Hepatitis B vaccine. Adults should be immunized if they wish to be protected from this disease, have certain high-risk conditions, may be exposed to blood or other infectious body fluids, are household contacts or sex partners of hepatitis B positive people, are clients or workers in certain care facilities, or travel to or work in countries with a high rate of hepatitis B.  Haemophilus influenzae type b (Hib) vaccine. A previously unvaccinated person with asplenia or sickle cell disease or having a scheduled splenectomy should receive 1 dose of Hib vaccine. Regardless of previous immunization, a recipient of a hematopoietic stem cell  transplant should receive a 3-dose series 6-12 months after his successful transplant. Hib vaccine is not recommended for adults with HIV infection. Preventive Service / Frequency Ages 51 to 30  Blood pressure check.** / Every 1 to 2 years.  Lipid and cholesterol check.** / Every 5 years beginning at age 16.  Hepatitis C blood test.** / For any individual with known risks for hepatitis C.  Skin self-exam. / Monthly.  Influenza vaccine. / Every year.  Tetanus, diphtheria, and acellular pertussis (Tdap, Td) vaccine.** / Consult your health care provider. 1 dose of Td every 10 years.  Varicella vaccine.** / Consult your health care provider.  HPV vaccine. / 3 doses over 6 months, if 10 or younger.  Measles, mumps, rubella (MMR) vaccine.** / You need at least 1 dose of MMR if you were born in 1957 or later. You may also need a second dose.  Pneumococcal 13-valent conjugate (PCV13) vaccine.** / Consult your health care provider.  Pneumococcal polysaccharide (PPSV23) vaccine.** / 1 to 2 doses if you smoke cigarettes or if you have certain conditions.  Meningococcal vaccine.** / 1 dose if you are age 42 to 103 years and a Market researcher living in a residence hall, or have one of several medical conditions. You may also need additional booster doses.  Hepatitis A vaccine.** / Consult your health care provider.  Hepatitis B vaccine.** / Consult your health care provider.  Haemophilus influenzae type b (Hib) vaccine.** / Consult your health care provider. Ages 27 to 2  Blood pressure check.** / Every 1 to 2 years.  Lipid and cholesterol check.** / Every 5 years beginning at age 22.  Lung cancer screening. / Every year if you are aged 58-80 years and have a 30-pack-year history of smoking and currently smoke or have quit within the past 15 years. Yearly screening is stopped once you have quit smoking for at least 15 years or develop a health problem that would prevent you from  having lung cancer treatment.  Fecal occult blood test (FOBT) of stool. / Every year beginning at age 12 and continuing until age 50. You may not have to do this test if you get  a colonoscopy every 10 years.  Flexible sigmoidoscopy** or colonoscopy.** / Every 5 years for a flexible sigmoidoscopy or every 10 years for a colonoscopy beginning at age 64 and continuing until age 18.  Hepatitis C blood test.** / For all people born from [birth year] through 1964 and any individual with known risks for hepatitis C.  Skin self-exam. / Monthly.  Influenza vaccine. / Every year.  Tetanus, diphtheria, and acellular pertussis (Tdap/Td) vaccine.** / Consult your health care provider. 1 dose of Td every 10 years.  Varicella vaccine.** / Consult your health care provider.  Zoster vaccine.** / 1 dose for adults aged 17 years or older.  Measles, mumps, rubella (MMR) vaccine.** / You need at least 1 dose of MMR if you were born in 1956 or later. You may also need a second dose.  Pneumococcal 13-valent conjugate (PCV13) vaccine.** / Consult your health care provider.  Pneumococcal polysaccharide (PPSV23) vaccine.** / 1 to 2 doses if you smoke cigarettes or if you have certain conditions.  Meningococcal vaccine.** / Consult your health care provider.  Hepatitis A vaccine.** / Consult your health care provider.  Hepatitis B vaccine.** / Consult your health care provider.  Haemophilus influenzae type b (Hib) vaccine.** / Consult your health care provider. Ages 70 and over  Blood pressure check.** / Every 1 to 2 years.  Lipid and cholesterol check.**/ Every 5 years beginning at age 63.  Lung cancer screening. / Every year if you are aged 75-80 years and have a 30-pack-year history of smoking and currently smoke or have quit within the past 15 years. Yearly screening is stopped once you have quit smoking for at least 15 years or develop a health problem that would prevent you from having lung cancer  treatment.  Fecal occult blood test (FOBT) of stool. / Every year beginning at age 96 and continuing until age 40. You may not have to do this test if you get a colonoscopy every 10 years.  Flexible sigmoidoscopy** or colonoscopy.** / Every 5 years for a flexible sigmoidoscopy or every 10 years for a colonoscopy beginning at age 81 and continuing until age 65.  Hepatitis C blood test.** / For all people born from [birth year] through 1964 and any individual with known risks for hepatitis C.  Abdominal aortic aneurysm (AAA) screening.** / A one-time screening for ages 39 to 33 years who are current or former smokers.  Skin self-exam. / Monthly.  Influenza vaccine. / Every year.  Tetanus, diphtheria, and acellular pertussis (Tdap/Td) vaccine.** / 1 dose of Td every 10 years.  Varicella vaccine.** / Consult your health care provider.  Zoster vaccine.** / 1 dose for adults aged 59 years or older.  Pneumococcal 13-valent conjugate (PCV13) vaccine.** / Consult your health care provider.  Pneumococcal polysaccharide (PPSV23) vaccine.** / 1 dose for all adults aged 60 years and older.  Meningococcal vaccine.** / Consult your health care provider.  Hepatitis A vaccine.** / Consult your health care provider.  Hepatitis B vaccine.** / Consult your health care provider.  Haemophilus influenzae type b (Hib) vaccine.** / Consult your health care provider. **Family history and personal history of risk and conditions may change your health care provider's recommendations. Document Released: 03/16/2001 Document Revised: 01/23/2013 Document Reviewed: 06/15/2010 [facility] Patient Information 2015 [place], [place]. This information is not intended to replace advice given to you by your health care provider. Make sure you discuss any questions you have with your health care provider.

## 2014-09-25 NOTE — Progress Notes (Signed)
Pre visit review using our clinic review tool, if applicable. No additional management support is needed unless otherwise documented below in the visit note. 

## 2014-09-25 NOTE — Telephone Encounter (Signed)
Rx lipitor and refer to cardiology.

## 2014-09-26 ENCOUNTER — Other Ambulatory Visit (INDEPENDENT_AMBULATORY_CARE_PROVIDER_SITE_OTHER): Payer: Managed Care, Other (non HMO)

## 2014-09-26 DIAGNOSIS — R739 Hyperglycemia, unspecified: Secondary | ICD-10-CM

## 2014-09-26 LAB — HEMOGLOBIN A1C: Hgb A1c MFr Bld: 5.4 % (ref 4.6–6.5)

## 2014-09-26 NOTE — Addendum Note (Signed)
Addended by: Tasia Catchings on: 09/26/2014 03:10 PM   Modules accepted: Orders

## 2014-12-31 ENCOUNTER — Other Ambulatory Visit: Payer: Managed Care, Other (non HMO)

## 2015-02-14 ENCOUNTER — Telehealth: Payer: Self-pay | Admitting: Family

## 2015-02-14 ENCOUNTER — Ambulatory Visit (INDEPENDENT_AMBULATORY_CARE_PROVIDER_SITE_OTHER): Payer: BC Managed Care – PPO | Admitting: Medical

## 2015-02-14 ENCOUNTER — Encounter: Payer: Self-pay | Admitting: Medical

## 2015-02-14 ENCOUNTER — Ambulatory Visit (HOSPITAL_BASED_OUTPATIENT_CLINIC_OR_DEPARTMENT_OTHER)
Admission: RE | Admit: 2015-02-14 | Discharge: 2015-02-14 | Disposition: A | Discharge status: Home / Self Care | Payer: BC Managed Care – PPO | Source: Ambulatory Visit | Attending: Medical | Admitting: Medical

## 2015-02-14 VITALS — BP 122/90 | HR 78 | Temp 98.4°F | Resp 16 | Ht 70.0 in | Wt 208.4 lb

## 2015-02-14 DIAGNOSIS — M79671 Pain in right foot: Secondary | ICD-10-CM | POA: Diagnosis not present

## 2015-02-14 MED ORDER — DICLOFENAC SODIUM 75 MG PO TBEC: 75.0000 mg | DELAYED_RELEASE_TABLET | Freq: Two times a day (BID) | ORAL | Status: DC

## 2015-02-14 MED ORDER — TRAMADOL HCL 50 MG PO TABS: 50.0000 mg | ORAL_TABLET | Freq: Four times a day (QID) | ORAL | Status: DC | PRN

## 2015-02-14 NOTE — Telephone Encounter (Signed)
Caller name: Yazn   Relationship to patient: Self   Can be reached: [phone] (cell [phone]    Reason for call: Pt says that he fell about 2-3 weeks ago and would like to know if PCP could just okay him going downstairs for a x-ray.  Please advise further.    Thanks.

## 2015-02-14 NOTE — Telephone Encounter (Signed)
Correction to below documentation.  It appears that we were able to get him in with PA, Saguier today in our office.

## 2015-02-14 NOTE — Patient Instructions (Signed)
I will get xray of your rt heel/foot today.  Diclofenac for mild pain. Tramadol for more severe pain.  Consider ace wrap for compression and support.  Follow up 5-7 days or as needed

## 2015-02-14 NOTE — Telephone Encounter (Signed)
Per verbal from Pulcifer in front office. Pt walked in to the office to see if we could order xrays. Advised her to make pt aware that he will need to be seen by a Provider before any xrays can be ordered. She states she has already checked our Providers and there are no availabilities. She will refer pt to urgent care.

## 2015-02-14 NOTE — Progress Notes (Signed)
Pre visit review using our clinic review tool, if applicable. No additional management support is needed unless otherwise documented below in the visit note/SLS  

## 2015-02-14 NOTE — Progress Notes (Signed)
   Subjective:    Patient ID: Lawrence Mccarty, male    DOB: 07-25-1965, 50 y.o.   MRN: [id number]  HPI  Pt jumped off his boat distance of about 7 feet. Pt had some ankle pain at first but ankle does not hurt at all. But he has some severe heel pain. In the morning pain is the worse. But sometimes during the day pain is less.  Review of Systems  Constitutional: Negative for fever, chills and fatigue.  Respiratory: Negative for cough, choking, shortness of breath and wheezing.   Cardiovascular: Negative for chest pain and palpitations.  Musculoskeletal:       Rt heel pain. No ankle  Pain.  Hematological: Negative for adenopathy. Does not bruise/bleed easily.    Past Medical History  Diagnosis Date  . Basal cell carcinoma 2004    nose  . Hyperlipidemia   . Chronic headaches   . Reactive airway disease   . Facial injury age 54    chain saw accident  . Osgood-Schlatter's disease     right knee  . Mass     right wrist ulnar    Social History   Social History  . Marital Status: Married    Spouse Name: N/A  . Number of Children: 2  . Years of Education: N/A   Occupational History  . 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 Smoking status: Never Smoker   . Smokeless tobacco: Never Used  . Alcohol Use: 3.5 oz/week    7 Standard drinks or equivalent per week     Comment: beer, wine  . Drug Use: No  . Sexual Activity: Not on file   Other Topics Concern  . Not on file   Social History Narrative   Regular exercise:   No          Past Surgical History  Procedure Laterality Date  . Basal cell carcinoma excision  2004    nose  . Tonsillectomy    . Wrist surgery  05/2005    ulnar mass removed  . Colonoscopy  07/14/10    diverticulosis and internal hemorrhoids    Family History  Problem Relation Age of Onset  . Hyperlipidemia Father     mother  . Hypertension Father   . Colon polyps Sister   . Hyperlipidemia Mother     No Known Allergies  Current  Outpatient Prescriptions on File Prior to Visit  Medication Sig Dispense Refill  . ranitidine (ZANTAC) 150 MG capsule Take 1 capsule (150 mg total) by mouth 2 (two) times daily. 60 capsule 0   No current facility-administered medications on file prior to visit.    BP 122/90 mmHg  Pulse 78  Temp(Src) 98.4 F (36.9 C) (Oral)  Resp 16  Ht 5\' 10"  (1.778 m)  Wt 208 lb 6 oz (94.518 kg)  BMI 29.90 kg/m2  SpO2 97%       Objective:   Physical Exam  General- no acute distress. Rt ankle- no pain at all on palpation medial and lateral. Rt foot- no pain on palpation except some pain on palpation of the heel.      Assessment & Plan:  I will get xray of your rt heel/foot today.  Diclofenac for mild pain. Tramadol for more severe pain.  Consider ace wrap for compression and support.  Follow up 5-7 days or as needed

## 2015-02-16 ENCOUNTER — Telehealth: Payer: Self-pay | Admitting: Medical

## 2015-02-16 DIAGNOSIS — M79671 Pain in right foot: Secondary | ICD-10-CM

## 2015-02-16 NOTE — Telephone Encounter (Signed)
Referred to sport med. 

## 2015-02-18 ENCOUNTER — Ambulatory Visit: Payer: BC Managed Care – PPO | Admitting: Family Medicine

## 2015-10-02 ENCOUNTER — Emergency Department (HOSPITAL_BASED_OUTPATIENT_CLINIC_OR_DEPARTMENT_OTHER)
Admission: EM | Admit: 2015-10-02 | Discharge: 2015-10-03 | Disposition: A | Discharge status: Home / Self Care | Payer: BC Managed Care – PPO | Attending: Emergency Medicine | Admitting: Emergency Medicine

## 2015-10-02 ENCOUNTER — Encounter (HOSPITAL_BASED_OUTPATIENT_CLINIC_OR_DEPARTMENT_OTHER): Payer: Self-pay | Admitting: *Deleted

## 2015-10-02 ENCOUNTER — Emergency Department (HOSPITAL_BASED_OUTPATIENT_CLINIC_OR_DEPARTMENT_OTHER): Payer: BC Managed Care – PPO

## 2015-10-02 DIAGNOSIS — Z85828 Personal history of other malignant neoplasm of skin: Secondary | ICD-10-CM | POA: Diagnosis not present

## 2015-10-02 DIAGNOSIS — R109 Unspecified abdominal pain: Secondary | ICD-10-CM | POA: Diagnosis present

## 2015-10-02 DIAGNOSIS — K5732 Diverticulitis of large intestine without perforation or abscess without bleeding: Secondary | ICD-10-CM

## 2015-10-02 LAB — CBC
HCT: 43 % (ref 39.0–52.0)
Hemoglobin: 15.1 g/dL (ref 13.0–17.0)
MCH: 32 pg (ref 26.0–34.0)
MCHC: 35.1 g/dL (ref 30.0–36.0)
MCV: 91.1 fL (ref 78.0–100.0)
Platelets: 318 K/uL (ref 150–400)
RBC: 4.72 MIL/uL (ref 4.22–5.81)
RDW: 12.7 % (ref 11.5–15.5)
WBC: 15 K/uL — ABNORMAL HIGH (ref 4.0–10.5)

## 2015-10-02 LAB — URINALYSIS, ROUTINE W REFLEX MICROSCOPIC
Bilirubin Urine: NEGATIVE
Glucose, UA: NEGATIVE mg/dL
Ketones, ur: NEGATIVE mg/dL
Leukocytes, UA: NEGATIVE
Nitrite: NEGATIVE
Protein, ur: NEGATIVE mg/dL
Specific Gravity, Urine: 1.02 (ref 1.005–1.030)
pH: 5.5 (ref 5.0–8.0)

## 2015-10-02 LAB — URINE MICROSCOPIC-ADD ON
RBC / HPF: NONE SEEN RBC/hpf (ref 0–5)
WBC UA: NONE SEEN WBC/hpf (ref 0–5)

## 2015-10-02 LAB — COMPREHENSIVE METABOLIC PANEL
ALK PHOS: 58 U/L (ref 38–126)
ALT: 21 U/L (ref 17–63)
ANION GAP: 9 (ref 5–15)
AST: 18 U/L (ref 15–41)
Albumin: 4.4 g/dL (ref 3.5–5.0)
BUN: 13 mg/dL (ref 6–20)
CALCIUM: 9.6 mg/dL (ref 8.9–10.3)
CHLORIDE: 104 mmol/L (ref 101–111)
CO2: 26 mmol/L (ref 22–32)
Creatinine, Ser: 0.91 mg/dL (ref 0.61–1.24)
GFR calc non Af Amer: >60 mL/min (ref >60)
Glucose, Bld: 101 mg/dL — ABNORMAL HIGH (ref 65–99)
POTASSIUM: 4.3 mmol/L (ref 3.5–5.1)
SODIUM: 139 mmol/L (ref 135–145)
Total Bilirubin: 0.9 mg/dL (ref 0.3–1.2)
Total Protein: 7.9 g/dL (ref 6.5–8.1)

## 2015-10-02 LAB — LIPASE, BLOOD: LIPASE: 22 U/L (ref 11–51)

## 2015-10-02 MED ORDER — CIPROFLOXACIN IN D5W 400 MG/200ML IV SOLN
400.0000 mg | Freq: Once | INTRAVENOUS | Status: AC
  Administered 2015-10-02: 400 mg via INTRAVENOUS
  Filled 2015-10-02: qty 200

## 2015-10-02 MED ORDER — IOPAMIDOL (ISOVUE-300) INJECTION 61%
100.0000 mL | Freq: Once | INTRAVENOUS | Status: AC | PRN
  Administered 2015-10-02: 100 mL via INTRAVENOUS

## 2015-10-02 MED ORDER — HYDROCODONE-ACETAMINOPHEN 5-325 MG PO TABS: 2.0000 | ORAL_TABLET | Freq: Once | ORAL | Status: DC

## 2015-10-02 MED ORDER — MORPHINE SULFATE (PF) 4 MG/ML IV SOLN
4.0000 mg | Freq: Once | INTRAVENOUS | Status: AC
  Administered 2015-10-02: 4 mg via INTRAVENOUS
  Filled 2015-10-02: qty 1

## 2015-10-02 MED ORDER — ONDANSETRON HCL 4 MG/2ML IJ SOLN
4.0000 mg | Freq: Once | INTRAMUSCULAR | Status: AC
  Administered 2015-10-02: 4 mg via INTRAVENOUS
  Filled 2015-10-02: qty 2

## 2015-10-02 MED ORDER — METRONIDAZOLE IN NACL 5-0.79 MG/ML-% IV SOLN
500.0000 mg | Freq: Once | INTRAVENOUS | Status: AC
  Administered 2015-10-02: 500 mg via INTRAVENOUS
  Filled 2015-10-02: qty 100

## 2015-10-02 MED ORDER — SODIUM CHLORIDE 0.9 % IV BOLUS (SEPSIS)
1000.0000 mL | Freq: Once | INTRAVENOUS | Status: AC
  Administered 2015-10-02: 1000 mL via INTRAVENOUS

## 2015-10-02 NOTE — ED Triage Notes (Signed)
LLQ pain since this am.  No fever, vomiting.  Pt has had some mild nausea with this.  No GU symptoms.

## 2015-10-02 NOTE — ED Provider Notes (Signed)
[place] [facility] Provider Note   CSN: [id number] Arrival date & time: 10/02/15  1856  By signing my name below, I, Lawrence Mccarty, attest that this documentation has been prepared under the direction and in the presence of No att. providers found  Electronically Signed: Delton Mccarty, ED Scribe. 10/02/15. 8:06 PM.    History   Chief Complaint Chief Complaint  Patient presents with  . Abdominal Pain    The history is provided by the patient. No language interpreter was used.     Lawrence Mccarty is a 50 y.o. male who presents to the Emergency Department complaining of a constant and worsening left abdominal pain which began this morning. Patient describes the pain as "a fork stabbing" feeling. Pain is exacerbated with ambulation without any alleviating factors. Pt denies associated fevers, constipation, diarrhea, rash and dysuria. Pt was diagnosed with diverticulosis 2 years ago following a colonoscopy but has never had diverticulitis. No headache, nausea, vomiting. No chest pain or SOB. No dysuria or flank pain. Pain began gradually and has progressively worsened. Denies any known fevers and has been able to tolerate PO intake. No other medical problems. No h/o DM or steroid use.  Past Medical History:  Diagnosis Date  . Basal cell carcinoma 2004   nose  . Chronic headaches   . Facial injury age 69   chain saw accident  . Hyperlipidemia   . Mass    right wrist ulnar  . Osgood-Schlatter's disease    right knee  . Reactive airway disease     Patient Active Problem List   Diagnosis Date Noted  . Wellness examination 09/25/2014  . Hand crush injury 08/31/2014  . Right-sided chest pain 08/31/2014  . GERD (gastroesophageal reflux disease) 11/24/2012  . Mouth sore 09/22/2011  . Rectal bleeding 07/08/2010  . SHOULDER PAIN, LEFT 02/18/2010  . BASAL CELL CARCINOMA, HX OF 02/18/2010  . 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  . HYPERCHOLESTEROLEMIA 09/06/2007    Past Surgical  History:  Procedure Laterality Date  . BASAL CELL CARCINOMA EXCISION  2004   nose  . COLONOSCOPY  07/14/10   diverticulosis and internal hemorrhoids  . TONSILLECTOMY    . WRIST SURGERY  05/2005   ulnar mass removed       Home Medications    Prior to Admission medications   Medication Sig Start Date End Date Taking? Authorizing Provider  ciprofloxacin (CIPRO) 500 MG tablet Take 1 tablet (500 mg total) by mouth 2 (two) times daily. 10/03/15 10/13/15  Duffy Bruce, MD  diclofenac (VOLTAREN) 75 MG EC tablet Take 1 tablet (75 mg total) by mouth 2 (two) times daily. 02/14/15   Percell Miller Saguier, PA-C  HYDROcodone-acetaminophen (NORCO/VICODIN) 5-325 MG tablet Take 1 tablet by mouth every 4 (four) hours as needed for severe pain. 10/03/15   Duffy Bruce, MD  metroNIDAZOLE (FLAGYL) 500 MG tablet Take 1 tablet (500 mg total) by mouth 3 (three) times daily. 10/03/15 10/13/15  Duffy Bruce, MD  ranitidine (ZANTAC) 150 MG capsule Take 1 capsule (150 mg total) by mouth 2 (two) times daily. 09/18/14   Percell Miller Saguier, PA-C  traMADol (ULTRAM) 50 MG tablet Take 1 tablet (50 mg total) by mouth every 6 (six) hours as needed. 02/14/15   Mackie Pai, PA-C    Family History Family History  Problem Relation Age of Onset  . Hyperlipidemia Father     mother  . Hypertension Father   . Hyperlipidemia Mother   . Colon polyps Sister     Social  History Social History  Substance Use Topics  . Smoking status: Never Smoker  . Smokeless tobacco: Never Used  . Alcohol use 3.5 oz/week    7 Standard drinks or equivalent per week     Comment: beer, wine     Allergies   Review of patient's allergies indicates no known allergies.   Review of Systems Review of Systems  Constitutional: Positive for chills. Negative for fatigue and fever.  HENT: Negative for congestion and rhinorrhea.   Eyes: Negative for visual disturbance.  Respiratory: Negative for cough, shortness of breath and wheezing.   Cardiovascular:  Negative for chest pain and leg swelling.  Gastrointestinal: Positive for abdominal pain and constipation. Negative for diarrhea, nausea and vomiting.  Genitourinary: Negative for dysuria and flank pain.  Musculoskeletal: Negative for neck pain and neck stiffness.  Skin: Negative for rash and wound.  Allergic/Immunologic: Negative for immunocompromised state.  Neurological: Negative for syncope, weakness and headaches.  All other systems reviewed and are negative.    Physical Exam Updated Vital Signs BP 120/65 (BP Location: Right Arm)   Pulse 88   Temp 98.1 F (36.7 C) (Oral)   Resp 18   Wt 200 lb (90.7 kg)   SpO2 100%   BMI 28.70 kg/m   Physical Exam  Constitutional: He is oriented to person, place, and time. He appears well-developed and well-nourished. No distress.  HENT:  Head: Normocephalic and atraumatic.  Eyes: Conjunctivae are normal.  Neck: Neck supple.  Cardiovascular: Normal rate, regular rhythm and normal heart sounds.  Exam reveals no friction rub.   No murmur heard. Pulmonary/Chest: Effort normal and breath sounds normal. No respiratory distress. He has no wheezes. He has no rales.  Abdominal: He exhibits no distension. There is tenderness in the left lower quadrant. There is no rigidity, no rebound and no guarding.  Musculoskeletal: He exhibits no edema.  Neurological: He is alert and oriented to person, place, and time. He exhibits normal muscle tone.  Skin: Skin is warm. Capillary refill takes less than 2 seconds.  Psychiatric: He has a normal mood and affect.  Nursing note and vitals reviewed.    ED Treatments / Results  DIAGNOSTIC STUDIES:  Oxygen Saturation is 99% on room air, normal by my interpretation.    COORDINATION OF CARE:  8:02 PM Will order Ct Scan, urinalysis, and bloodwork. Will give fluids, morphine, and zofran. Discussed treatment plan with pt at bedside and pt agreed to plan.  Labs (all labs ordered are listed, but only abnormal  results are displayed) Labs Reviewed  COMPREHENSIVE METABOLIC PANEL - Abnormal; Notable for the following:       Result Value   Glucose, Bld 101 (*)    All other components within normal limits  CBC - Abnormal; Notable for the following:    WBC 15.0 (*)    All other components within normal limits  URINALYSIS, ROUTINE W REFLEX MICROSCOPIC (NOT AT [facility]) - Abnormal; Notable for the following:    Hgb urine dipstick SMALL (*)    All other components within normal limits  URINE MICROSCOPIC-ADD ON - Abnormal; Notable for the following:    Squamous Epithelial / LPF 0-5 (*)    Bacteria, UA RARE (*)    All other components within normal limits  LIPASE, BLOOD    EKG  EKG Interpretation None       Radiology Ct Abdomen Pelvis W Contrast  Result Date: 10/02/2015 CLINICAL DATA:  Left lower quadrant pain for 1 day. EXAM: CT ABDOMEN  AND PELVIS WITH CONTRAST TECHNIQUE: Multidetector CT imaging of the abdomen and pelvis was performed using the standard protocol following bolus administration of intravenous contrast. CONTRAST:  129mL ISOVUE-300 IOPAMIDOL (ISOVUE-300) INJECTION 61% COMPARISON:  None. FINDINGS: Dependent atelectasis in the lung bases. The liver, spleen, gallbladder, pancreas, adrenal glands, kidneys, abdominal aorta, inferior vena cava, and retroperitoneal lymph nodes are unremarkable. Stomach, small bowel, and colon are not abnormally distended. Contrast material flows through to the colon without evidence of bowel obstruction. Small duodenum diverticulum. No free air or free fluid in the abdomen. Pelvis: Diverticulosis of the sigmoid colon with colonic wall thickening and inflammatory infiltration at the junction of the sigmoid and descending regions. Appearance is consistent with acute diverticulitis. There is associated edema along the left iliopsoas muscle. No discrete abscess. Small amount of free fluid in the pelvis is likely reactive. Bladder wall is not thickened. Prostate gland is  enlarged, measuring 4.8 cm diameter. No destructive bone lesions. IMPRESSION: Changes of acute diverticulitis with inflammatory process involving the junction of the sigmoid and descending colon. There is associated edema and free pelvic fluid. No discrete abscess identified. Electronically Signed   By: Lucienne Capers M.D.   On: 10/02/2015 23:08    Procedures Procedures (including critical care time)  Medications Ordered in ED Medications  HYDROcodone-acetaminophen (NORCO/VICODIN) 5-325 MG per tablet 2 tablet (2 tablets Oral Refused 10/03/15 0057)  sodium chloride 0.9 % bolus 1,000 mL (0 mLs Intravenous Stopped 10/02/15 2303)  morphine 4 MG/ML injection 4 mg (4 mg Intravenous Given 10/02/15 2026)  ondansetron (ZOFRAN) injection 4 mg (4 mg Intravenous Given 10/02/15 2027)  iopamidol (ISOVUE-300) 61 % injection 100 mL (100 mLs Intravenous Contrast Given 10/02/15 2252)  ciprofloxacin (CIPRO) IVPB 400 mg (0 mg Intravenous Stopped 10/03/15 0045)  metroNIDAZOLE (FLAGYL) IVPB 500 mg (0 mg Intravenous Stopped 10/03/15 0045)     Initial Impression / Assessment and Plan / ED Course  I have reviewed the triage vital signs and the nursing notes.  Pertinent labs & imaging results that were available during my care of the patient were reviewed by me and considered in my medical decision making (see chart for details).  Clinical Course   50 year old male with past medical history of diverticulosis who presents with an aching, gnawing, left lower quadrant abdominal pain. On arrival, vital signs are stable within normal limits. He has moderate left lower quadrant tenderness on my examination, but no rebound, guarding, or signs of peritonitis. Primary suspicion is acute diverticulitis. Differential also includes colitis, UTI, less likely renal stone. No evidence of testicular pathology. No right lower quadrant tenderness or signs of appendicitis or cholecystitis. He does have a mild leukocytosis on initial screening  lab work, but labs are otherwise reassuring. Will give IV fluids, check, UA, and obtain CT scan.  CT scan shows acute, uncomplicated diverticulitis of the sigmoid and descending colon. No recent antibiotic use or risk factors for C. difficile. There is no evidence of perforation or complication and patient is otherwise healthy. Given his leukocytosis and amount of inflammation on CT, first dose of IV antibiotics given here. Will discharge with outpatient Cipro, Flagyl, and pain control. Return precautions were given in detail.  Final Clinical Impressions(s) / ED Diagnoses   Final diagnoses:  Diverticulitis of large intestine without perforation or abscess without bleeding    New Prescriptions Discharge Medication List as of 10/03/2015  1:08 AM    START taking these medications   Details  ciprofloxacin (CIPRO) 500 MG tablet Take 1 tablet (  500 mg total) by mouth 2 (two) times daily., Starting Fri 10/03/2015, Until Mon 10/13/2015, Print    HYDROcodone-acetaminophen (NORCO/VICODIN) 5-325 MG tablet Take 1 tablet by mouth every 4 (four) hours as needed for severe pain., Starting Fri 10/03/2015, Print    metroNIDAZOLE (FLAGYL) 500 MG tablet Take 1 tablet (500 mg total) by mouth 3 (three) times daily., Starting Fri 10/03/2015, Until Mon 10/13/2015, Print       I personally performed the services described in this documentation, which was scribed in my presence. The recorded information has been reviewed and is accurate.    Duffy Bruce, MD 10/03/15 [phone]

## 2015-10-03 ENCOUNTER — Telehealth: Payer: Self-pay | Admitting: Family

## 2015-10-03 MED ORDER — CIPROFLOXACIN HCL 500 MG PO TABS: 500.0000 mg | ORAL_TABLET | Freq: Two times a day (BID) | ORAL | 0 refills | Status: AC

## 2015-10-03 MED ORDER — METRONIDAZOLE 500 MG PO TABS
500.0000 mg | ORAL_TABLET | Freq: Three times a day (TID) | ORAL | 0 refills | Status: AC
Start: 2015-10-03 — End: 2015-10-13

## 2015-10-03 MED ORDER — HYDROCODONE-ACETAMINOPHEN 5-325 MG PO TABS: 1.0000 | ORAL_TABLET | ORAL | 0 refills | Status: DC | PRN

## 2015-10-03 NOTE — Telephone Encounter (Signed)
Please arrange a 1 week hospital follow up with me.

## 2015-10-03 NOTE — Telephone Encounter (Signed)
Called pt. He says that he will give our office a call back on Tuesday to schedule.

## 2016-02-01 IMAGING — DX DG CHEST 2V
2 series · 2 of 2 positions shown · non-contrast
Comparison: 08/30/2014

CLINICAL DATA: Heart burn, chest pain for 2 weeks

EXAM:
CHEST  2 VIEW

[chest pa]
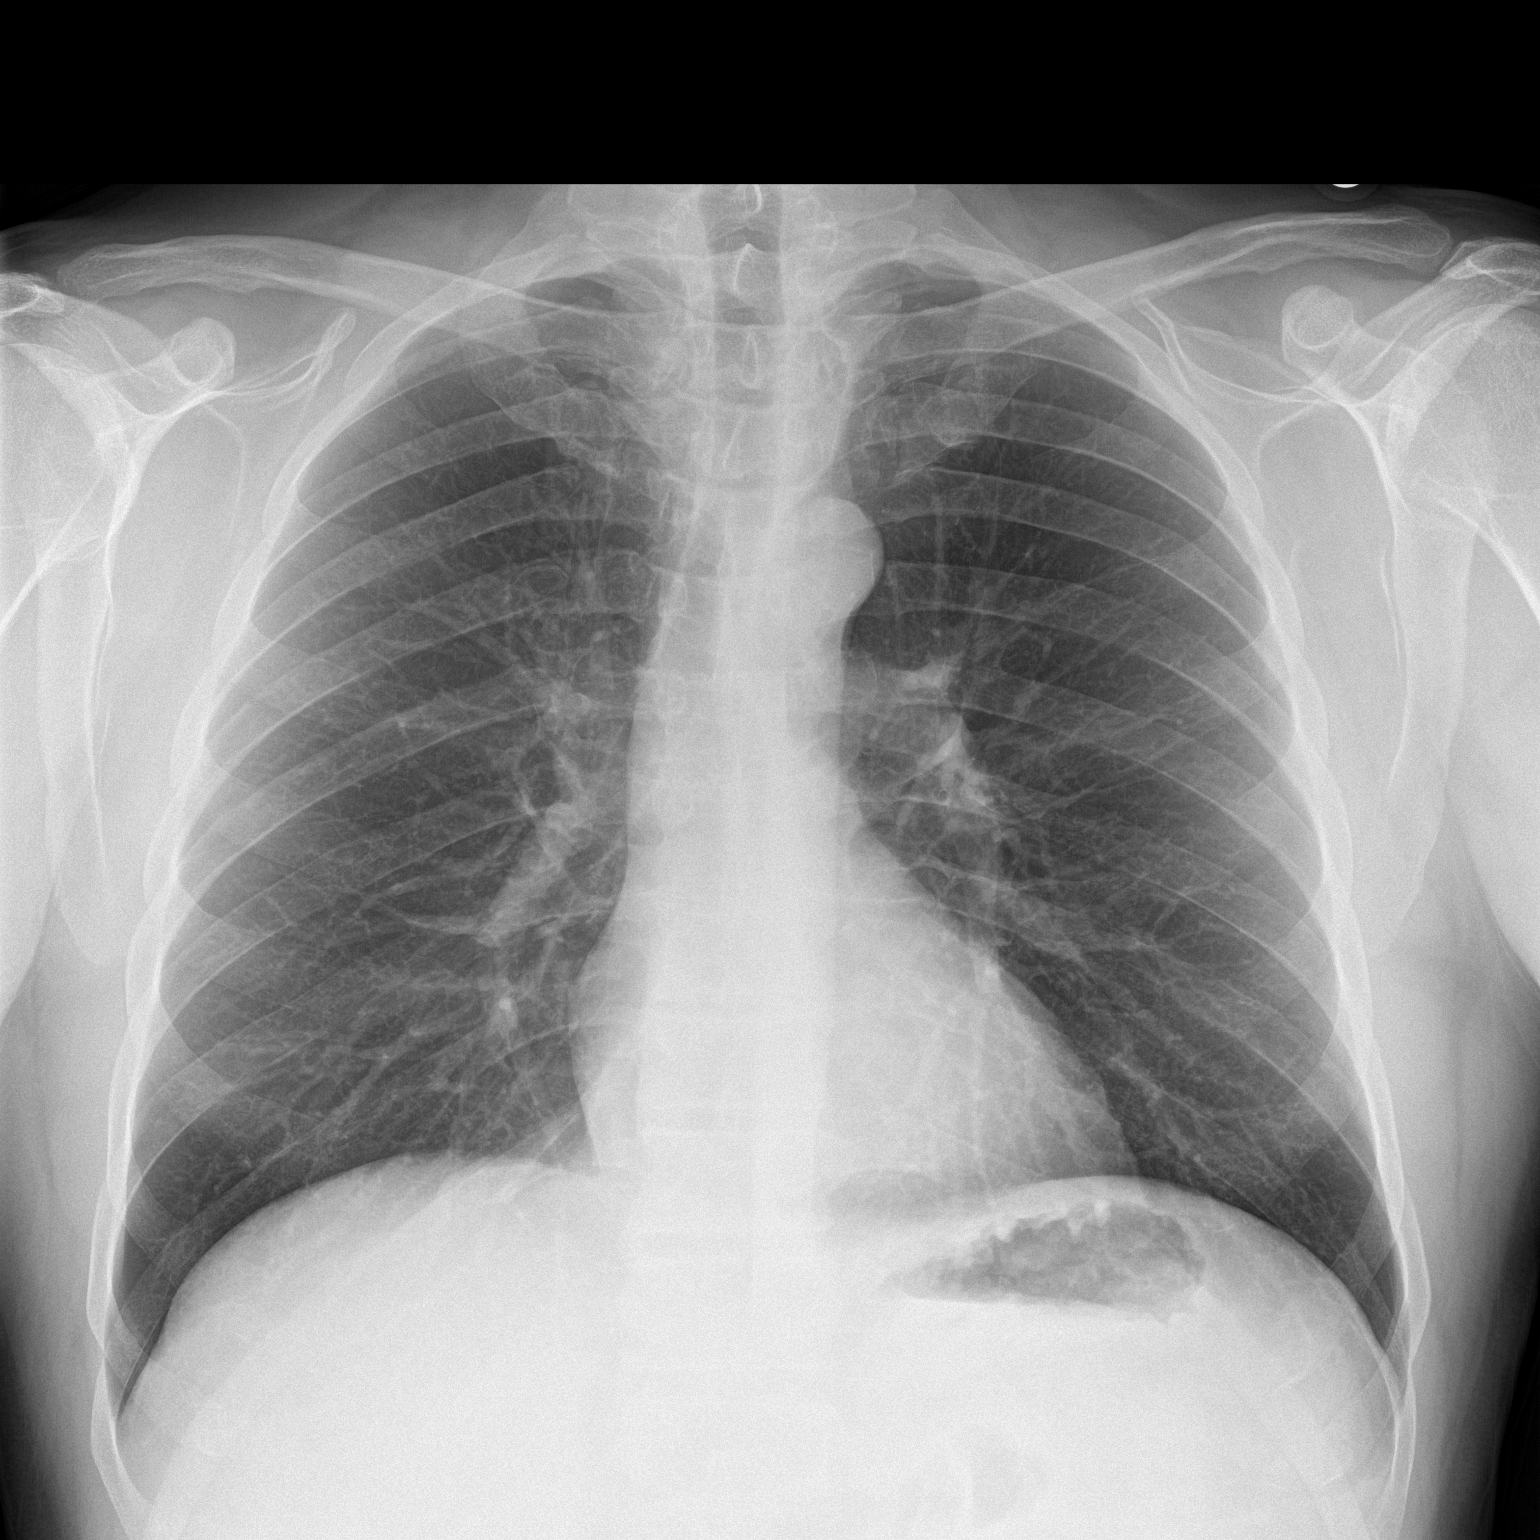

[chest lat]
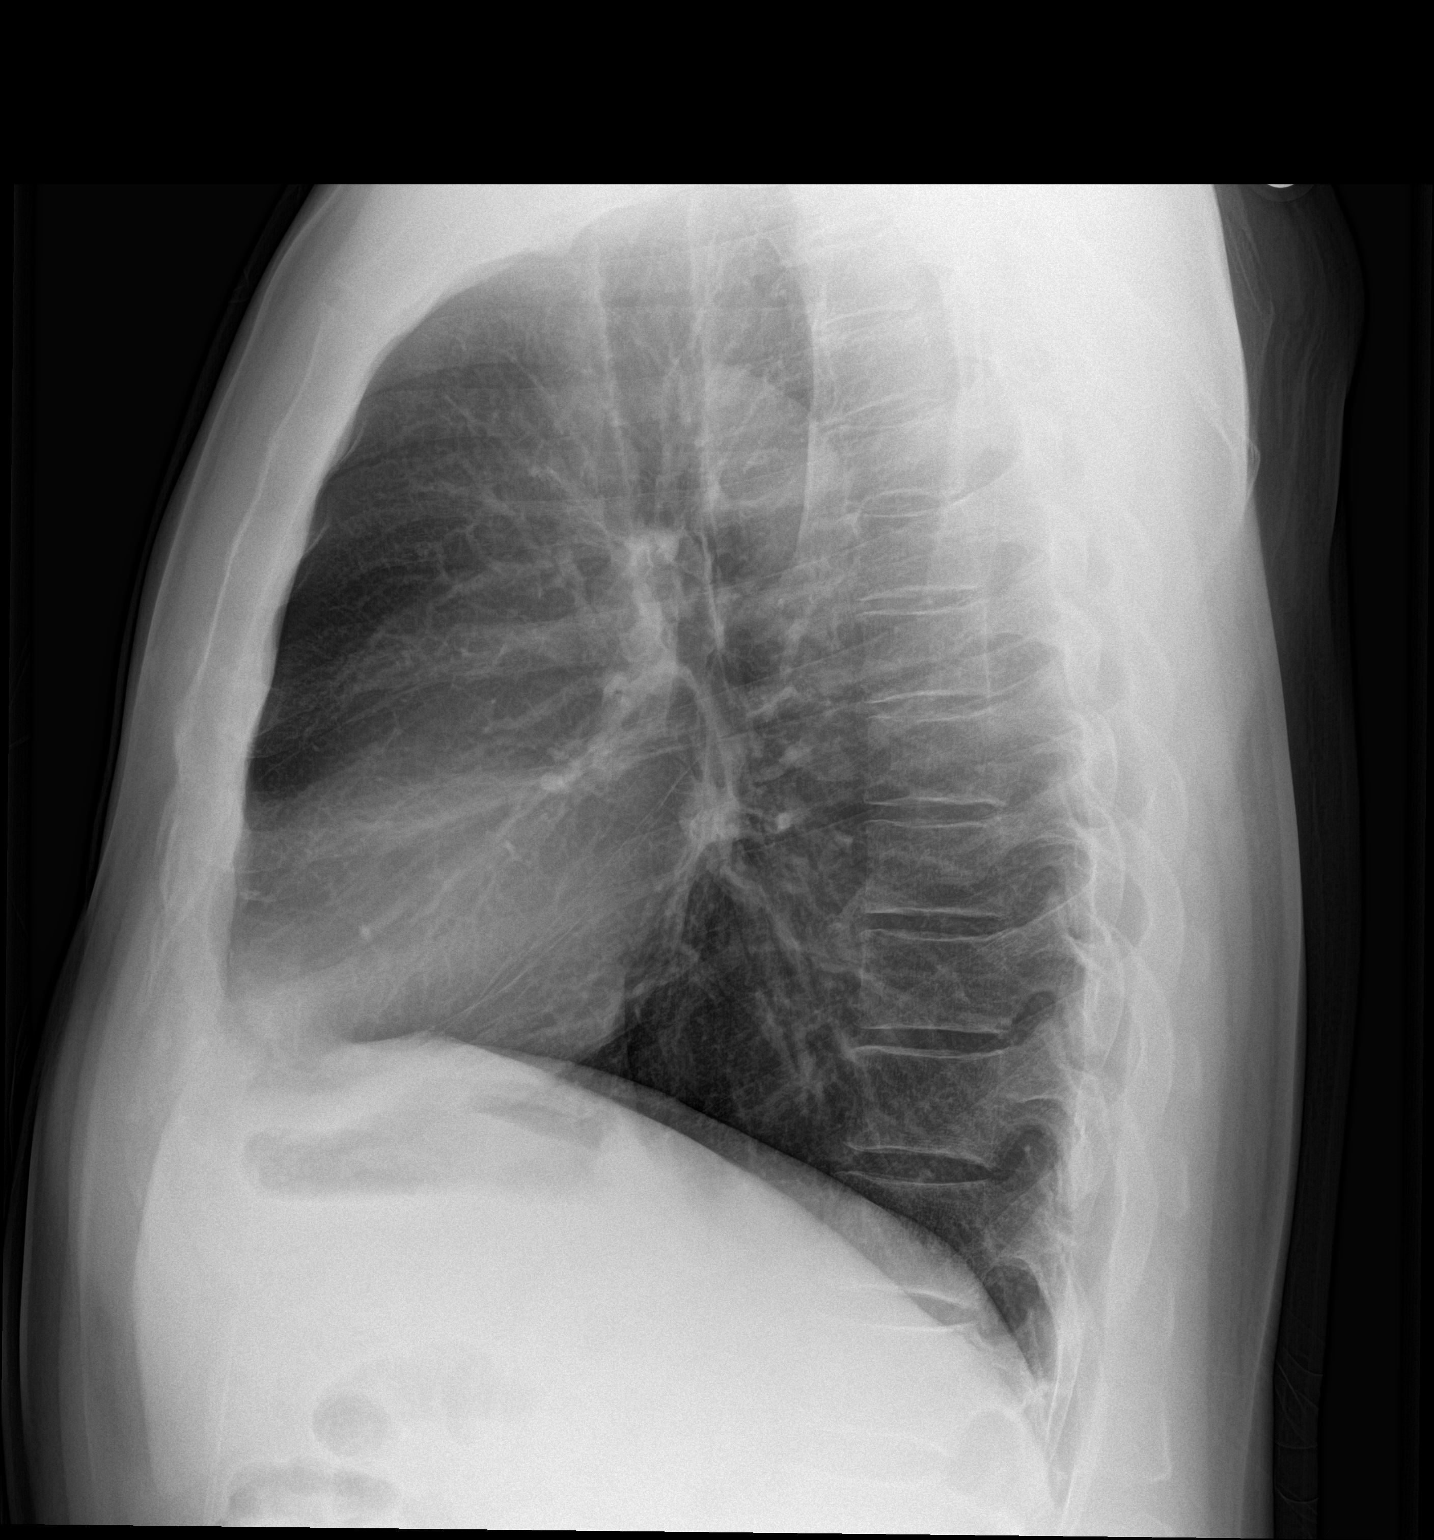

[2 of 2 positions shown; findings below may reference images not displayed]

FINDINGS: The heart size and mediastinal contours are within normal limits.
Both lungs are clear. The visualized skeletal structures are
unremarkable.
IMPRESSION: No active cardiopulmonary disease.

## 2016-10-18 ENCOUNTER — Encounter (HOSPITAL_BASED_OUTPATIENT_CLINIC_OR_DEPARTMENT_OTHER): Payer: Self-pay | Admitting: Emergency Medicine

## 2016-10-18 ENCOUNTER — Emergency Department (HOSPITAL_BASED_OUTPATIENT_CLINIC_OR_DEPARTMENT_OTHER): Payer: BC Managed Care – PPO

## 2016-10-18 DIAGNOSIS — Y92014 Private driveway to single-family (private) house as the place of occurrence of the external cause: Secondary | ICD-10-CM | POA: Diagnosis not present

## 2016-10-18 DIAGNOSIS — Y999 Unspecified external cause status: Secondary | ICD-10-CM | POA: Insufficient documentation

## 2016-10-18 DIAGNOSIS — Z85828 Personal history of other malignant neoplasm of skin: Secondary | ICD-10-CM | POA: Diagnosis not present

## 2016-10-18 DIAGNOSIS — S63072A Subluxation of distal end of left ulna, initial encounter: Secondary | ICD-10-CM | POA: Insufficient documentation

## 2016-10-18 DIAGNOSIS — Y9389 Activity, other specified: Secondary | ICD-10-CM | POA: Insufficient documentation

## 2016-10-18 DIAGNOSIS — J45909 Unspecified asthma, uncomplicated: Secondary | ICD-10-CM | POA: Insufficient documentation

## 2016-10-18 DIAGNOSIS — S52125A Nondisplaced fracture of head of left radius, initial encounter for closed fracture: Secondary | ICD-10-CM | POA: Insufficient documentation

## 2016-10-18 DIAGNOSIS — W1789XA Other fall from one level to another, initial encounter: Secondary | ICD-10-CM | POA: Diagnosis not present

## 2016-10-18 DIAGNOSIS — S59912A Unspecified injury of left forearm, initial encounter: Secondary | ICD-10-CM | POA: Diagnosis present

## 2016-10-18 DIAGNOSIS — Z79899 Other long term (current) drug therapy: Secondary | ICD-10-CM | POA: Diagnosis not present

## 2016-10-18 NOTE — ED Triage Notes (Signed)
Pt fell ~ 5 feet onto concrete from his boat in driveway. C/o pain from upper arm to fingertips in L arm. No sensation changes, no obvious deformity, able to move fingers. Sling and icepack applied in triage.

## 2016-10-19 ENCOUNTER — Emergency Department (HOSPITAL_BASED_OUTPATIENT_CLINIC_OR_DEPARTMENT_OTHER): Payer: BC Managed Care – PPO

## 2016-10-19 ENCOUNTER — Emergency Department (HOSPITAL_BASED_OUTPATIENT_CLINIC_OR_DEPARTMENT_OTHER)
Admission: EM | Admit: 2016-10-19 | Discharge: 2016-10-19 | Disposition: A | Discharge status: Home / Self Care | Payer: BC Managed Care – PPO | Attending: Emergency Medicine | Admitting: Emergency Medicine

## 2016-10-19 DIAGNOSIS — IMO0001 Reserved for inherently not codable concepts without codable children: Secondary | ICD-10-CM

## 2016-10-19 DIAGNOSIS — S52125A Nondisplaced fracture of head of left radius, initial encounter for closed fracture: Secondary | ICD-10-CM

## 2016-10-19 MED ORDER — IBUPROFEN 800 MG PO TABS
800.0000 mg | ORAL_TABLET | Freq: Once | ORAL | Status: AC
  Administered 2016-10-19: 800 mg via ORAL
  Filled 2016-10-19: qty 1

## 2016-10-19 MED ORDER — IBUPROFEN 600 MG PO TABS: 600.0000 mg | ORAL_TABLET | Freq: Four times a day (QID) | ORAL | 0 refills | Status: DC | PRN

## 2016-10-19 NOTE — ED Provider Notes (Signed)
[place] [facility] Provider Note   CSN: [id number] Arrival date & time: 10/18/16  2333     History   Chief Complaint Chief Complaint  Patient presents with  . Arm Injury    HPI Lawrence Mccarty is a 51 y.o. male.  HPI  This is a 51 year old male who presents following a fall. Patient reports that at 4:30 PM he was cleaning his boat when he fell approximately 5 feet onto his left arm. At that time he had minimal pain. However, he has had progressive left elbow and wrist pain. He went to the drugstore to get a wrist splint which did not help. No numbness or tingling in the hand.  He states that he took Tylenol and Percocet at home with minimal relief. Current pain is 8 out of 10. Denies hitting his head or loss of consciousness. Denies other injury.  Past Medical History:  Diagnosis Date  . Basal cell carcinoma 2004   nose  . Chronic headaches   . Facial injury age 40   chain saw accident  . Hyperlipidemia   . Mass    right wrist ulnar  . Osgood-Schlatter's disease    right knee  . Reactive airway disease     Patient Active Problem List   Diagnosis Date Noted  . Wellness examination 09/25/2014  . Hand crush injury 08/31/2014  . Right-sided chest pain 08/31/2014  . GERD (gastroesophageal reflux disease) 11/24/2012  . Mouth sore 09/22/2011  . Rectal bleeding 07/08/2010  . SHOULDER PAIN, LEFT 02/18/2010  . BASAL CELL CARCINOMA, HX OF 02/18/2010  . 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  . HYPERCHOLESTEROLEMIA 09/06/2007    Past Surgical History:  Procedure Laterality Date  . BASAL CELL CARCINOMA EXCISION  2004   nose  . COLONOSCOPY  07/14/10   diverticulosis and internal hemorrhoids  . TONSILLECTOMY    . WRIST SURGERY  05/2005   ulnar mass removed       Home Medications    Prior to Admission medications   Medication Sig Start Date End Date Taking? Authorizing Provider  diclofenac (VOLTAREN) 75 MG EC tablet Take 1 tablet (75 mg total) by mouth 2 (two) times  daily. 02/14/15  Yes Saguier, Percell Miller, PA-C  HYDROcodone-acetaminophen (NORCO/VICODIN) 5-325 MG tablet Take 1 tablet by mouth every 4 (four) hours as needed for severe pain. 10/03/15  Yes Duffy Bruce, MD  traMADol (ULTRAM) 50 MG tablet Take 1 tablet (50 mg total) by mouth every 6 (six) hours as needed. 02/14/15  Yes Saguier, Percell Miller, PA-C  ibuprofen (ADVIL,MOTRIN) 600 MG tablet Take 1 tablet (600 mg total) by mouth every 6 (six) hours as needed. 10/19/16   Shardae Kleinman, Barbette Hair, MD  ranitidine (ZANTAC) 150 MG capsule Take 1 capsule (150 mg total) by mouth 2 (two) times daily. 09/18/14   Saguier, Percell Miller, PA-C    Family History Family History  Problem Relation Age of Onset  . Hyperlipidemia Father        mother  . Hypertension Father   . Hyperlipidemia Mother   . Colon polyps Sister     Social History Social History  Substance Use Topics  . Smoking status: Never Smoker  . Smokeless tobacco: Never Used  . Alcohol use 3.5 oz/week    7 Standard drinks or equivalent per week     Comment: beer, wine     Allergies   Patient has no known allergies.   Review of Systems Review of Systems  Musculoskeletal:       Left  elbow and wrist pain  Skin: Negative for color change and wound.  Neurological: Negative for weakness and numbness.  All other systems reviewed and are negative.    Physical Exam Updated Vital Signs BP (!) 138/91 (BP Location: Right Arm)   Pulse 69   Temp 98.1 F (36.7 C) (Oral)   Resp 18   Ht 5\' 10"  (1.778 m)   Wt 91.2 kg (201 lb)   SpO2 97%   BMI 28.84 kg/m   Physical Exam  Constitutional: He is oriented to person, place, and time. He appears well-developed and well-nourished.  HENT:  Head: Normocephalic and atraumatic.  Cardiovascular: Normal rate and regular rhythm.   Pulmonary/Chest: Effort normal. No respiratory distress.  Musculoskeletal: He exhibits no edema.  Focused examination of the left arm reveals normal range of motion of the left elbow, intact  flexion and extension of the wrist and all digits, no obvious deformities, there is tenderness to palpation over the lateral aspect of the elbow, tenderness palpation over the ulnar aspect of the wrist, 2+ radial pulse  Neurological: He is alert and oriented to person, place, and time.  Skin: Skin is warm and dry.  Psychiatric: He has a normal mood and affect.  Nursing note and vitals reviewed.    ED Treatments / Results  Labs (all labs ordered are listed, but only abnormal results are displayed) Labs Reviewed - No data to display  EKG  EKG Interpretation None       Radiology Dg Forearm Left  Result Date: 10/19/2016 CLINICAL DATA:  Initial evaluation for acute trauma, fall. EXAM: LEFT FOREARM - 2 VIEW COMPARISON:  None. FINDINGS: Subtle cortical irregularity at the radial head/ neck, suspicious for possible nondisplaced fracture. No other acute fracture or dislocation. IMPRESSION: 1. Question subtle cortical irregularity at the left radial head/neck, suspicious for acute nondisplaced fracture. Further evaluation with dedicated radiograph of the left although recommended. 2. No other acute osseous abnormality about the left forearm. Electronically Signed   By: Jeannine Boga M.D.   On: 10/19/2016 00:11   Dg Wrist Complete Left  Result Date: 10/19/2016 CLINICAL DATA:  [place] off a boat, injury to the left wrist EXAM: LEFT WRIST - COMPLETE 3+ VIEW COMPARISON:  10/18/2016, 08/30/2014 FINDINGS: No fracture is seen. On the lateral view, there appears to be mild dorsal subluxation of the distal ulna. IMPRESSION: Suspect mild dorsal subluxation of the distal ulna. Electronically Signed   By: Donavan Foil M.D.   On: 10/19/2016 00:23   Dg Humerus Left  Result Date: 10/19/2016 CLINICAL DATA:  Initial evaluation for acute trauma, fall. EXAM: LEFT HUMERUS - 2+ VIEW COMPARISON:  None. FINDINGS: There is no evidence of fracture or other focal bone lesions. Soft tissues are unremarkable.  IMPRESSION: Negative. Electronically Signed   By: Jeannine Boga M.D.   On: 10/19/2016 00:10   Dg Hand Complete Left  Result Date: 10/19/2016 CLINICAL DATA:  Initial evaluation for acute trauma, fall. EXAM: LEFT HAND - COMPLETE 3+ VIEW COMPARISON:  None. FINDINGS: There is no evidence of fracture or dislocation. There is no evidence of arthropathy or other focal bone abnormality. Soft tissues are unremarkable. IMPRESSION: Negative. Electronically Signed   By: Jeannine Boga M.D.   On: 10/19/2016 00:13    Procedures Procedures (including critical care time)  Medications Ordered in ED Medications  ibuprofen (ADVIL,MOTRIN) tablet 800 mg (not administered)     Initial Impression / Assessment and Plan / ED Course  I have reviewed the triage vital signs  and the nursing notes.  Pertinent labs & imaging results that were available during my care of the patient were reviewed by me and considered in my medical decision making (see chart for details).     Patient presents with left arm pain following a fall. He is nontoxic. Exam reveals tenderness but normal range of motion at both the elbow and the wrist without deformity. He is neurovascularly intact. X-rays concerning for occult radial head fracture as well as mild subluxation of the distal ulna. Given intact exam, no indication for reductions or interventions at this time. I will splint him in a posterior splint and have recommended very close sports medicine or orthopedic follow-up for clearance for early range of motion exercises. Patient given ibuprofen for anti-inflammatory effect. Recommend ice and elevation as well.  After history, exam, and medical workup I feel the patient has been appropriately medically screened and is safe for discharge home. Pertinent diagnoses were discussed with the patient. Patient was given return precautions.   Final Clinical Impressions(s) / ED Diagnoses   Final diagnoses:  Closed nondisplaced  fracture of head of left radius, initial encounter  ECU (extensor carpi ulnaris), subluxation/dislocation, left, initial encounter    New Prescriptions New Prescriptions   IBUPROFEN (ADVIL,MOTRIN) 600 MG TABLET    Take 1 tablet (600 mg total) by mouth every 6 (six) hours as needed.     Merryl Hacker, MD 10/19/16 [phone]

## 2016-10-19 NOTE — Discharge Instructions (Signed)
You were seen today after a fall. You likely have a nondisplaced radial head fracture and may have subluxation of the ulna at the wrist. Immobilization and early range of motion is recommended for both. He will be placed in a splint and sling. Follow-up with sports medicine within 2 days for clearance for early range of motion exercises.

## 2017-02-17 ENCOUNTER — Encounter: Payer: Self-pay | Admitting: *Deleted

## 2017-02-25 ENCOUNTER — Ambulatory Visit: Payer: BC Managed Care – PPO | Admitting: Cardiology

## 2017-09-19 ENCOUNTER — Encounter: Payer: Self-pay | Admitting: Internal Medicine

## 2020-08-01 ENCOUNTER — Telehealth: Payer: Self-pay | Admitting: Family

## 2020-08-01 NOTE — Telephone Encounter (Signed)
Patient last seen in 2017 would like to re'eastablish with [facility]. Please advise.

## 2020-08-05 NOTE — Telephone Encounter (Signed)
Ok

## 2020-08-13 ENCOUNTER — Other Ambulatory Visit: Payer: Self-pay

## 2020-08-13 ENCOUNTER — Telehealth: Payer: Self-pay | Admitting: Family

## 2020-08-13 ENCOUNTER — Ambulatory Visit: Payer: BC Managed Care – PPO | Admitting: Family

## 2020-08-13 ENCOUNTER — Encounter: Payer: Self-pay | Admitting: Family

## 2020-08-13 VITALS — BP 122/80 | HR 74 | Temp 98.0°F | Resp 16 | Ht 70.0 in | Wt 219.0 lb

## 2020-08-13 DIAGNOSIS — B009 Herpesviral infection, unspecified: Secondary | ICD-10-CM | POA: Diagnosis not present

## 2020-08-13 DIAGNOSIS — Z23 Encounter for immunization: Secondary | ICD-10-CM | POA: Diagnosis not present

## 2020-08-13 DIAGNOSIS — E785 Hyperlipidemia, unspecified: Secondary | ICD-10-CM

## 2020-08-13 DIAGNOSIS — Z125 Encounter for screening for malignant neoplasm of prostate: Secondary | ICD-10-CM | POA: Diagnosis not present

## 2020-08-13 DIAGNOSIS — Z0000 Encounter for general adult medical examination without abnormal findings: Secondary | ICD-10-CM | POA: Diagnosis not present

## 2020-08-13 DIAGNOSIS — R739 Hyperglycemia, unspecified: Secondary | ICD-10-CM | POA: Insufficient documentation

## 2020-08-13 LAB — LIPID PANEL
Cholesterol: 263 mg/dL — ABNORMAL HIGH (ref 0–200)
HDL: 53.7 mg/dL (ref >39.00)
LDL Cholesterol: 180 mg/dL — ABNORMAL HIGH (ref 0–99)
NonHDL: 209.66
Total CHOL/HDL Ratio: 5
Triglycerides: 147 mg/dL (ref 0.0–149.0)
VLDL: 29.4 mg/dL (ref 0.0–40.0)

## 2020-08-13 LAB — COMPREHENSIVE METABOLIC PANEL
ALT: 32 U/L (ref 0–53)
AST: 23 U/L (ref 0–37)
Albumin: 4.6 g/dL (ref 3.5–5.2)
Alkaline Phosphatase: 52 U/L (ref 39–117)
BUN: 16 mg/dL (ref 6–23)
CO2: 28 mEq/L (ref 19–32)
Calcium: 9.9 mg/dL (ref 8.4–10.5)
Chloride: 102 mEq/L (ref 96–112)
Creatinine, Ser: 0.99 mg/dL (ref 0.40–1.50)
GFR: 86.49 mL/min (ref >60.00)
Glucose, Bld: 112 mg/dL — ABNORMAL HIGH (ref 70–99)
Potassium: 4.8 mEq/L (ref 3.5–5.1)
Sodium: 137 mEq/L (ref 135–145)
Total Bilirubin: 0.5 mg/dL (ref 0.2–1.2)
Total Protein: 7.1 g/dL (ref 6.0–8.3)

## 2020-08-13 LAB — PSA: PSA: 0.45 ng/mL (ref 0.10–4.00)

## 2020-08-13 MED ORDER — VALACYCLOVIR HCL 1 G PO TABS: ORAL_TABLET | ORAL | 1 refills | Status: DC

## 2020-08-13 NOTE — Telephone Encounter (Signed)
Form faxed to [facility]

## 2020-08-13 NOTE — Assessment & Plan Note (Signed)
Has used valtrex in the past with good response to 1-2 times/year breakouts. Valtrex as ordered below.

## 2020-08-13 NOTE — Progress Notes (Signed)
Subjective:   By signing my name below, I, Shehryar Baig, attest that this documentation has been prepared under the direction and in the presence of Sandford Craze NP. 08/13/2020     Patient ID: Lawrence Mccarty, male    DOB: 07-21-1965, 55 y.o.   MRN: [id number]  Chief Complaint  Patient presents with   Re-Establish Care    HPI Patient is in today for new patient care.   He denies having any rashes, diarrhea, constipation, burning or frequency, no depression or anxiety, and no cough or cold symptoms at this time.  He doesn't smoke tobacco products. He drinks 7 cans of alcohol weekly. He reports having Covid-19 a couple of months ago and that it was mild.   Family history- His father passed away 6 months ago and his sister had colon polyps, his paternal grandfather passed away from heart attack and his maternal grandfather passed away from  throat cancer, otherwise he has no recent changes to his family history.  Cold sores- He reports having cold sore 1-2 times a year. He takes valtrex to manage his symptoms and finds great relief. Irregular heart beat- During his last colonoscopy he was told he had irregular heartbeat by his provider. He denies having any palpitations. Skin Cancer- Her was diagnosed with basal cell carcinoma but has recovered and stopped treatment. GERD- His GERD has improved since his last visit. He reports changing his diet to manage his symptoms.  Immunizations- He has his Covid-19 vaccines at this time. Blood pressure- His blood pressure is well during this visit.  BP Readings from Last 3 Encounters:  08/13/20 122/80  10/19/16 132/78  10/03/15 120/65   Immunizations: He has 2 pfizer Covid-19 vaccines at this time and is willing to get the booster vaccines at a later time. He is UTD on tetanus vaccines. He is due for the shingles vaccine. He is willing to get HIV and hepatitis screening.  Diet:  Exercise: He does not regularly participate in exercise. He  reports being constantly on his feet and moving for his daily job.  Colonoscopy: Patient reports it was last completed 2015. Results showed diverticulosis, otherwise results are normal. Repeat in 10 years.  PSA: He is due for a PSA screening. Dental: He is UTD on dental care at this time. Vision: He is UTD on vision care at this time.  Health Maintenance Due  Topic Date Due   COLONOSCOPY (Pts 45-37yrs Insurance coverage will need to be confirmed)  Never done   Zoster Vaccines- Shingrix (1 of 2) Never done   COVID-19 Vaccine (3 - Booster for Pfizer series) 11/27/2019    Past Medical History:  Diagnosis Date   Amplified musculoskeletal pain    Basal cell carcinoma 2004   nose   BASAL CELL CARCINOMA, HX OF 02/18/2010   Qualifier: Diagnosis of  By: Peggyann Juba FNP, Deshae Dickison S    Benign essential hypertension    Chronic headaches    Facial injury age 89   chain saw accident   GERD (gastroesophageal reflux disease) 11/24/2012   Hand crush injury 08/31/2014   HYPERCHOLESTEROLEMIA 09/06/2007   Qualifier: Diagnosis of  By: Andrey Campanile MD, LauraLee     Hyperlipidemia    Mass    right wrist ulnar   Mouth sore 09/22/2011   Osgood-Schlatter's disease    right knee   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   Qualifier: Diagnosis of  By: Andrey Campanile MD, LauraLee     Rectal bleeding 07/08/2010   Right-sided chest pain  08/31/2014   SHOULDER PAIN, LEFT 02/18/2010   Qualifier: Diagnosis of  By: Inda Castle FNP, Wellington Hampshire     Past Surgical History:  Procedure Laterality Date   BASAL CELL CARCINOMA EXCISION  2004   nose   COLONOSCOPY  07/14/10   diverticulosis and internal hemorrhoids   TONSILLECTOMY     WRIST SURGERY  05/2005   ulnar mass removed    Family History  Problem Relation Age of Onset   Hyperlipidemia Mother    Hyperlipidemia Father        mother   Hypertension Father    Brain cancer Father        glioblastoma   Colon polyps Sister    Cancer Maternal Grandfather        throat, no-smoker   CAD  Paternal Grandfather     Social History   Socioeconomic History   Marital status: Married    Spouse name: Not on file   Number of children: 2   Years of education: Not on file   Highest education level: Not on file  Occupational History   Occupation: MANAGER    Employer: [employer]  Tobacco Use   Smoking status: Never   Smokeless tobacco: Never  Vaping Use   Vaping Use: Never used  Substance and Sexual Activity   Alcohol use: Yes    Alcohol/week: 7.0 standard drinks    Types: 7 Standard drinks or equivalent per week    Comment: beer, wine   Drug use: No   Sexual activity: Yes    Partners: Female  Other Topics Concern   Not on file  Social History Narrative   Regular exercise:   No   Nurse, adult   Daughter- age 44 (moving to [place])   Son age 64   Enjoys boating and golfing   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 20 tablet 0   HYDROcodone-acetaminophen (NORCO/VICODIN) 5-325 MG tablet Take 1 tablet by mouth every 4 (four) hours as needed for severe pain. 12 tablet 0   ibuprofen (ADVIL,MOTRIN) 600 MG tablet Take 1 tablet (600 mg total) by mouth every 6 (six) hours as needed. 30 tablet 0   ranitidine (ZANTAC) 150 MG capsule Take 1 capsule (150 mg total) by mouth 2 (two) times daily. 60 capsule 0   traMADol (ULTRAM) 50 MG tablet Take 1 tablet (50 mg total) by mouth every 6 (six) hours as needed. 20 tablet 0   No facility-administered medications prior to visit.    No Active Allergies  Review of Systems  HENT:   Negative for congestion.   Respiratory:  Negative for cough.   Gastrointestinal:  Negative for constipation and diarrhea.  Genitourinary:  Negative for dysuria and frequency.  Skin:  Negative for rash.  Psychiatric/Behavioral:  Negative for depression. The patient is not nervous/anxious.       Objective:    Physical Exam Constitutional:      General: He is not in acute distress.    Appearance: Normal appearance.  He is not ill-appearing.  HENT:     Head: Normocephalic and atraumatic.     Right Ear: Tympanic membrane, ear canal and external ear normal.     Left Ear: Tympanic membrane, ear canal and external ear normal.  Eyes:     Extraocular Movements: Extraocular movements intact.     Pupils: Pupils are equal, round, and reactive to light.  Cardiovascular:     Rate and Rhythm: Normal rate and regular rhythm.     Pulses: Normal pulses.     Heart sounds: Normal heart sounds. No murmur heard.   No gallop.  Pulmonary:     Effort: Pulmonary effort is normal. No respiratory distress.     Breath sounds: Normal breath sounds. No wheezing, rhonchi or rales.  Musculoskeletal:     Comments: 5/5 strength in both upper and lower extremities  Skin:    General: Skin is warm and dry.  Neurological:     Mental Status: He is alert and oriented to person, place, and time.  Psychiatric:        Behavior: Behavior normal.        Judgment: Judgment normal.    BP 122/80 (BP Location: Left Arm, Patient Position: Sitting, Cuff Size: Normal)   Pulse 74   Temp 98 F (36.7 C)   Resp 16   Ht $R'5\' 10"'zd$  (1.778 m)   Wt 219 lb (99.3 kg)   SpO2 98%   BMI 31.42 kg/m  Wt Readings from Last 3 Encounters:  08/13/20 219 lb (99.3 kg)  10/18/16 201 lb (91.2 kg)  10/02/15 200 lb (90.7 kg)       Assessment & Plan:   Problem List Items Addressed This Visit       Unprioritized   Preventative health care - Primary    Wt Readings from Last 3 Encounters:  08/13/20 219 lb (99.3 kg)  10/18/16 201 lb (91.2  kg)  10/02/15 200 lb (90.7 kg)  Discussed healthy diet, exercise and weight loss. Will request copy of colo from his GI. PSA today (discussed pro's/con's).  Shingrix #1 today. Recommended covid booster, recommended that he schedule follow up with his dermatologist for skin cancer screening and that he wear daily sunscreen.        Other Visit Diagnoses     Prostate cancer screening       Relevant Orders   PSA   Hyperlipidemia, unspecified hyperlipidemia type       Relevant Orders   Lipid panel   Comp Met (CMET)        Meds ordered this encounter  Medications   valACYclovir (VALTREX) 1000 MG tablet    Sig: Take one tab at the start of cold sore and repeat in 12 hrs    Dispense:  10 tablet    Refill:  1    Order Specific Question:   Supervising Provider    Answer:   Mosie Lukes [[id number]]    I, [name] NP, personally preformed the services described in this documentation.  All medical record entries made by the scribe were at my direction and in my presence.  I have reviewed the chart and discharge instructions (if applicable) and agree that the record reflects my personal performance and is accurate and complete. 08/13/2020   I,Shehryar Baig,acting as a Education administrator for Nance Pear, NP.,have documented all relevant documentation on the behalf of Nance Pear, NP,as directed by  Nance Pear, NP while in the presence of Nance Pear, NP.  Nance Pear, NP

## 2020-08-13 NOTE — Telephone Encounter (Signed)
Please call [facility] and request a copy of his last colo.

## 2020-08-13 NOTE — Assessment & Plan Note (Addendum)
Wt Readings from Last 3 Encounters:  08/13/20 219 lb (99.3 kg)  10/18/16 201 lb (91.2 kg)  10/02/15 200 lb (90.7 kg)   Discussed healthy diet, exercise and weight loss. Will request copy of colo from his GI. PSA today (discussed pro's/con's).  Shingrix #1 today. Recommended covid booster, recommended that he schedule follow up with his dermatologist for skin cancer screening and that he wear daily sunscreen.

## 2020-08-13 NOTE — Patient Instructions (Addendum)
Please complete lab work prior to leaving. Please get your covid booster #1 and then 4 months after that booster you can get booster #2. Welcome back!

## 2020-10-13 NOTE — Progress Notes (Signed)
CALDWELL TRIANO is a 55 y.o. male presents to the office today for Zoster: Recombinat (Shingles) dose 2 injection, per physician's orders. Original order: 08/13/2020- [name]- NP Zoster: Recombinat (Shingles) 0.5 mL IM was administered L Deltoid today. Patient tolerated injection. Patient due for follow up labs/provider appt: No. Date due: next appointment is already pending.   Creft, Darlis Loan

## 2020-10-14 ENCOUNTER — Ambulatory Visit (INDEPENDENT_AMBULATORY_CARE_PROVIDER_SITE_OTHER): Payer: BC Managed Care – PPO

## 2020-10-14 ENCOUNTER — Other Ambulatory Visit: Payer: Self-pay

## 2020-10-14 DIAGNOSIS — Z23 Encounter for immunization: Secondary | ICD-10-CM

## 2021-05-15 ENCOUNTER — Ambulatory Visit: Payer: BC Managed Care – PPO | Admitting: Family

## 2021-05-15 ENCOUNTER — Encounter: Payer: Self-pay | Admitting: Family

## 2021-05-15 VITALS — BP 132/87 | HR 62 | Temp 97.9°F | Resp 18 | Ht 70.0 in | Wt 215.2 lb

## 2021-05-15 DIAGNOSIS — E785 Hyperlipidemia, unspecified: Secondary | ICD-10-CM | POA: Insufficient documentation

## 2021-05-15 DIAGNOSIS — R41840 Attention and concentration deficit: Secondary | ICD-10-CM

## 2021-05-15 LAB — LIPID PANEL
Cholesterol: 220 mg/dL — ABNORMAL HIGH (ref 0–200)
HDL: 49.8 mg/dL (ref >39.00)
LDL Cholesterol: 151 mg/dL — ABNORMAL HIGH (ref 0–99)
NonHDL: 170.14
Total CHOL/HDL Ratio: 4
Triglycerides: 97 mg/dL (ref 0.0–149.0)
VLDL: 19.4 mg/dL (ref 0.0–40.0)

## 2021-05-15 NOTE — Progress Notes (Signed)
? ?Subjective:  ? ?By signing my name below, I, Lawrence Mccarty, attest that this documentation has been prepared under the direction and in the presence of [name] NP, 05/15/2021  ? ? Patient ID: Lawrence Mccarty, male    DOB: 1965-02-13, 56 y.o.   MRN: [id number] ? ?Chief Complaint  ?Patient presents with  ? adderall request.   ?  And wants to check blood work   ? ? ?HPI ?Patient is in today for an office visit. ? ?Lack of Concentration - He complains of lack of concentration. While working, he occasionally forgets to complete tasks. He also occasionally loses his train of thought.  His wife was concerned about his lack of concentration and recommended that he visited a specialist for his symptoms. He states that his son also has a lack of concentration and is currently taking medication for his symptoms. The patient denies of any anxiety or depression.  ? ?Blood Pressure - As of today's visit, his blood pressure is fine. ?BP Readings from Last 3 Encounters:  ?05/15/21 132/87  ?08/13/20 122/80  ?10/19/16 132/78  ? ?Diet - He has been substituting his meals with healthier alternatives.  ? ?Exercise - He has been regularly exercising at the gym. ?Wt Readings from Last 3 Encounters:  ?05/15/21 215 lb 3.2 oz (97.6 kg)  ?08/13/20 219 lb (99.3 kg)  ?10/18/16 201 lb (91.2 kg)  ? ?Health Maintenance Due  ?Topic Date Due  ? COLONOSCOPY (Pts 45-76yr Insurance coverage will need to be confirmed)  Never done  ? COVID-19 Vaccine (3 - Booster for Pfizer series) 08/22/2019  ? ? ?Past Medical History:  ?Diagnosis Date  ? Amplified musculoskeletal pain   ? Basal cell carcinoma 2004  ? nose  ? BASAL CELL CARCINOMA, HX OF 02/18/2010  ? Qualifier: Diagnosis of  By: OInda CastleFNP, MWellington Hampshire  ? Benign essential hypertension   ? Chronic headaches   ? Facial injury age 56 ? chain saw accident  ? GERD (gastroesophageal reflux disease) 11/24/2012  ? Hand crush injury 08/31/2014  ? HYPERCHOLESTEROLEMIA 09/06/2007  ? Qualifier:  Diagnosis of  By: WRedmond PullingMD, LFrann Rider   ? Hyperglycemia   ? Hyperlipidemia   ? Mass   ? right wrist ulnar  ? Mouth sore 09/22/2011  ? Osgood-Schlatter's disease   ? right knee  ? 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  ? Qualifier: Diagnosis of  By: WRedmond PullingMD, LFrann Rider   ? Rectal bleeding 07/08/2010  ? Right-sided chest pain 08/31/2014  ? SHOULDER PAIN, LEFT 02/18/2010  ? Qualifier: Diagnosis of  By: OInda CastleFNP, Vonzella Althaus S   ? ? ?Past Surgical History:  ?Procedure Laterality Date  ? BASAL CELL CARCINOMA EXCISION  2004  ? nose  ? COLONOSCOPY  07/14/10  ? diverticulosis and internal hemorrhoids  ? TONSILLECTOMY    ? WRIST SURGERY  05/2005  ? ulnar mass removed  ? ? ?Family History  ?Problem Relation Age of Onset  ? Hyperlipidemia Mother   ? Hyperlipidemia Father   ?     mother  ? Hypertension Father   ? Brain cancer Father   ?     glioblastoma  ? Colon polyps Sister   ? Cancer Maternal Grandfather   ?     throat, no-smoker  ? CAD Paternal Grandfather   ? ? ?Social History  ? ?Socioeconomic History  ? Marital status: Married  ?  Spouse name: Not on file  ? Number of children: 2  ? Years of  education: Not on file  ? Highest education level: Not on file  ?Occupational History  ? Occupation: MANAGER  ?  Employer: [employer]  ?Tobacco Use  ? Smoking status: Never  ? Smokeless tobacco: Never  ?Vaping Use  ? Vaping Use: Never used  ?Substance and Sexual Activity  ? Alcohol use: Yes  ?  Alcohol/week: 7.0 standard drinks  ?  Types: 7 Standard drinks or equivalent per week  ?  Comment: beer, wine  ? Drug use: No  ? Sexual activity: Yes  ?  Partners: Female  ?Other Topics Concern  ? Not on file  ?Social History Narrative  ? Regular exercise:   No  ? Boat Mechanic  ? Daughter- age 19 (moving to [place])  ? Son age 6  ? Enjoys boating and golfing  ? 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 in Business management  ? 2 cats  ? ?Social Determinants of Health  ? ?Financial Resource Strain: Not on file  ?Food Insecurity: Not on file  ?Transportation  Needs: Not on file  ?Physical Activity: Not on file  ?Stress: Not on file  ?Social Connections: Not on file  ?Intimate Partner Violence: Not on file  ? ? ?Outpatient Medications Prior to Visit  ?Medication Sig Dispense Refill  ? valACYclovir (VALTREX) 1000 MG tablet Take one tab at the start of cold sore and repeat in 12 hrs 10 tablet 1  ? ?No facility-administered medications prior to visit.  ? ? ?No Known Allergies ? ?Review of Systems  ?Psychiatric/Behavioral:  Negative for depression. The patient is not nervous/anxious.   ?     (+) Lack of Concentration  ? ?   ?Objective:  ?  ?Physical Exam ?Constitutional:   ?   General: He is not in acute distress. ?   Appearance: Normal appearance. He is not ill-appearing.  ?HENT:  ?   Head: Normocephalic and atraumatic.  ?   Right Ear: External ear normal.  ?   Left Ear: External ear normal.  ?Eyes:  ?   Extraocular Movements: Extraocular movements intact.  ?   Pupils: Pupils are equal, round, and reactive to light.  ?Cardiovascular:  ?   Rate and Rhythm: Normal rate and regular rhythm.  ?   Heart sounds: Normal heart sounds. No murmur heard. ?  No gallop.  ?Pulmonary:  ?   Effort: Pulmonary effort is normal. No respiratory distress.  ?   Breath sounds: Normal breath sounds. No wheezing or rales.  ?Skin: ?   General: Skin is warm and dry.  ?Neurological:  ?   Mental Status: He is alert and oriented to person, place, and time.  ?Psychiatric:     ?   Mood and Affect: Mood normal.     ?   Behavior: Behavior normal.     ?   Judgment: Judgment normal.  ? ? ?BP 132/87   Pulse 62   Temp 97.9 ?F (36.6 ?C) (Oral)   Resp 18   Ht '5\' 10"'$  (1.778 m)   Wt 215 lb 3.2 oz (97.6 kg)   SpO2 100%   BMI 30.88 kg/m?  ?Wt Readings from Last 3 Encounters:  ?05/15/21 215 lb 3.2 oz (97.6 kg)  ?08/13/20 219 lb (99.3 kg)  ?10/18/16 201 lb (91.2 kg)  ? ? ?   ?Assessment & Plan:  ? ?Problem List Items Addressed This Visit   ? ?  ? Unprioritized  ? Hyperlipidemia - Primary  ?  Obtain follow up lipid  panel. Continue work on healthy  diet, exercise, weight loss.   ? ?  ?  ? Relevant Orders  ? Lipid panel  ? Attention deficit  ?  Will refer to psychology for formal ADD evaluation.  Further recommendations pending review of evaluation.  ? ?  ?  ? Relevant Orders  ? Ambulatory referral to Psychology  ? ? ? ?No orders of the defined types were placed in this encounter. ? ? ?I, Nance Pear, NP, personally preformed the services described in this documentation.  All medical record entries made by the scribe were at my direction and in my presence.  I have reviewed the chart and discharge instructions (if applicable) and agree that the record reflects my personal performance and is accurate and complete. 05/15/2021 ? ? ?I,Amber Collins,acting as a Education administrator for [employer], NP.,have documented all relevant documentation on the behalf of Nance Pear, NP,as directed by  Nance Pear, NP while in the presence of Nance Pear, NP. ? ? ? ?Nance Pear, NP ? ?

## 2021-05-15 NOTE — Assessment & Plan Note (Signed)
Obtain follow up lipid panel. Continue work on [employer], exercise, weight loss.   ?

## 2021-05-15 NOTE — Patient Instructions (Signed)
Please complete lab work prior to leaving.   

## 2021-05-15 NOTE — Assessment & Plan Note (Signed)
Will refer to psychology for formal ADD evaluation.  Further recommendations pending review of evaluation.  ?

## 2021-08-01 HISTORY — PX: OTHER SURGICAL HISTORY: SHX169

## 2021-08-17 ENCOUNTER — Other Ambulatory Visit (INDEPENDENT_AMBULATORY_CARE_PROVIDER_SITE_OTHER): Payer: BC Managed Care – PPO

## 2021-08-17 ENCOUNTER — Ambulatory Visit: Payer: BC Managed Care – PPO | Admitting: Family

## 2021-08-17 VITALS — BP 125/71 | HR 70 | Temp 98.4°F | Resp 16 | Ht 70.0 in | Wt 191.0 lb

## 2021-08-17 DIAGNOSIS — R41840 Attention and concentration deficit: Secondary | ICD-10-CM | POA: Diagnosis not present

## 2021-08-17 DIAGNOSIS — E785 Hyperlipidemia, unspecified: Secondary | ICD-10-CM | POA: Diagnosis not present

## 2021-08-17 DIAGNOSIS — R634 Abnormal weight loss: Secondary | ICD-10-CM | POA: Diagnosis not present

## 2021-08-17 DIAGNOSIS — F4323 Adjustment disorder with mixed anxiety and depressed mood: Secondary | ICD-10-CM

## 2021-08-17 DIAGNOSIS — R739 Hyperglycemia, unspecified: Secondary | ICD-10-CM | POA: Diagnosis not present

## 2021-08-17 DIAGNOSIS — Z1211 Encounter for screening for malignant neoplasm of colon: Secondary | ICD-10-CM | POA: Diagnosis not present

## 2021-08-17 DIAGNOSIS — S92115D Nondisplaced fracture of neck of left talus, subsequent encounter for fracture with routine healing: Secondary | ICD-10-CM

## 2021-08-17 LAB — CBC WITH DIFFERENTIAL/PLATELET
Basophils Absolute: 0 10*3/uL (ref 0.0–0.1)
Basophils Relative: 0.7 % (ref 0.0–3.0)
Eosinophils Absolute: 0.1 10*3/uL (ref 0.0–0.7)
Eosinophils Relative: 3.1 % (ref 0.0–5.0)
HCT: 43.1 % (ref 39.0–52.0)
Hemoglobin: 14.6 g/dL (ref 13.0–17.0)
Lymphocytes Relative: 26.5 % (ref 12.0–46.0)
Lymphs Abs: 1.2 10*3/uL (ref 0.7–4.0)
MCHC: 33.9 g/dL (ref 30.0–36.0)
MCV: 94.3 fl (ref 78.0–100.0)
Monocytes Absolute: 0.3 10*3/uL (ref 0.1–1.0)
Monocytes Relative: 7.4 % (ref 3.0–12.0)
Neutro Abs: 2.9 10*3/uL (ref 1.4–7.7)
Neutrophils Relative %: 62.3 % (ref 43.0–77.0)
Platelets: 335 10*3/uL (ref 150.0–400.0)
RBC: 4.57 Mil/uL (ref 4.22–5.81)
RDW: 13.6 % (ref 11.5–15.5)
WBC: 4.6 10*3/uL (ref 4.0–10.5)

## 2021-08-17 LAB — COMPREHENSIVE METABOLIC PANEL
ALT: 13 U/L (ref 0–53)
AST: 14 U/L (ref 0–37)
Albumin: 4.5 g/dL (ref 3.5–5.2)
Alkaline Phosphatase: 46 U/L (ref 39–117)
BUN: 12 mg/dL (ref 6–23)
CO2: 27 mEq/L (ref 19–32)
Calcium: 9.7 mg/dL (ref 8.4–10.5)
Chloride: 105 mEq/L (ref 96–112)
Creatinine, Ser: 0.86 mg/dL (ref 0.40–1.50)
GFR: 97.61 mL/min (ref >60.00)
Glucose, Bld: 114 mg/dL — ABNORMAL HIGH (ref 70–99)
Potassium: 4.5 mEq/L (ref 3.5–5.1)
Sodium: 140 mEq/L (ref 135–145)
Total Bilirubin: 0.4 mg/dL (ref 0.2–1.2)
Total Protein: 6.5 g/dL (ref 6.0–8.3)

## 2021-08-17 LAB — TSH: TSH: 1.73 u[IU]/mL (ref 0.35–5.50)

## 2021-08-17 LAB — LIPID PANEL
Cholesterol: 184 mg/dL (ref 0–200)
HDL: 55.8 mg/dL (ref >39.00)
LDL Cholesterol: 109 mg/dL — ABNORMAL HIGH (ref 0–99)
NonHDL: 127.82
Total CHOL/HDL Ratio: 3
Triglycerides: 95 mg/dL (ref 0.0–149.0)
VLDL: 19 mg/dL (ref 0.0–40.0)

## 2021-08-17 LAB — HEMOGLOBIN A1C: Hgb A1c MFr Bld: 5.3 % (ref 4.6–6.5)

## 2021-08-17 NOTE — Assessment & Plan Note (Signed)
Repeat lipid panel. Expect it to be improved following his weight loss.
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
## 2021-08-17 NOTE — Addendum Note (Signed)
Addended by: Kelle Darting A on: 08/17/2021 02:24 PM   Modules accepted: Orders

## 2021-08-17 NOTE — Assessment & Plan Note (Signed)
We discussed eating small frequent meals (fruit, nuts etc.)  He would like to get to a BMI 25 or less.  Check baseline labs.

## 2021-08-17 NOTE — Assessment & Plan Note (Signed)
Will re-initiate referral to psychology for ADHD evaluation.

## 2021-08-17 NOTE — Patient Instructions (Signed)
Psychiatric Services:  [name] [phone] [name] ([name]) - [name]) [phone]

## 2021-08-17 NOTE — Assessment & Plan Note (Signed)
New.  Recommended that he establish with a counselor. I gave him information places to call to get established.

## 2021-09-08 ENCOUNTER — Ambulatory Visit (INDEPENDENT_AMBULATORY_CARE_PROVIDER_SITE_OTHER): Payer: BC Managed Care – PPO | Admitting: Family

## 2021-09-08 ENCOUNTER — Other Ambulatory Visit (HOSPITAL_BASED_OUTPATIENT_CLINIC_OR_DEPARTMENT_OTHER): Payer: Self-pay

## 2021-09-08 ENCOUNTER — Encounter: Payer: Self-pay | Admitting: Family

## 2021-09-08 VITALS — BP 127/80 | HR 72 | Temp 98.6°F | Resp 16 | Ht 70.0 in | Wt 178.0 lb

## 2021-09-08 DIAGNOSIS — N401 Enlarged prostate with lower urinary tract symptoms: Secondary | ICD-10-CM

## 2021-09-08 DIAGNOSIS — Z0000 Encounter for general adult medical examination without abnormal findings: Secondary | ICD-10-CM | POA: Diagnosis not present

## 2021-09-08 DIAGNOSIS — R351 Nocturia: Secondary | ICD-10-CM | POA: Diagnosis not present

## 2021-09-08 DIAGNOSIS — S92115D Nondisplaced fracture of neck of left talus, subsequent encounter for fracture with routine healing: Secondary | ICD-10-CM

## 2021-09-08 DIAGNOSIS — F4323 Adjustment disorder with mixed anxiety and depressed mood: Secondary | ICD-10-CM

## 2021-09-08 DIAGNOSIS — R739 Hyperglycemia, unspecified: Secondary | ICD-10-CM | POA: Diagnosis not present

## 2021-09-08 LAB — HEMOGLOBIN A1C: Hgb A1c MFr Bld: 5.4 % (ref 4.6–6.5)

## 2021-09-08 LAB — PSA: PSA: 0.42 ng/mL (ref 0.10–4.00)

## 2021-09-08 MED ORDER — TAMSULOSIN HCL 0.4 MG PO CAPS
0.4000 mg | ORAL_CAPSULE | Freq: Every day | ORAL | 1 refills | Status: DC
  Filled 2021-09-08: qty 90, 90d supply, fill #0

## 2021-09-08 NOTE — Patient Instructions (Addendum)
Please Call [facility] to schedule your colonoscopy. [phone]. Please complete lab work prior to leaving.

## 2021-09-08 NOTE — Assessment & Plan Note (Signed)
S/p pin removal, now in cam boot/partial weight bearing.  Management per ortho.
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
## 2021-09-08 NOTE — Assessment & Plan Note (Signed)
Improved. He and his wife have reconciled and home/family life is improving.

## 2021-09-08 NOTE — Assessment & Plan Note (Signed)
C/o nocturnal symptoms and incomplete bladder emptying. Check PSA, give trial of flomax. He will let me know how this is working in a few weeks.

## 2021-09-08 NOTE — Assessment & Plan Note (Signed)
Continue healthy diet and regular exercise when cleared by orthopedics. Gave pt number to call to schedule colonoscopy. Recommended covid and flu shots this fall.

## 2021-09-30 ENCOUNTER — Encounter: Payer: BC Managed Care – PPO | Admitting: Family

## 2021-10-14 ENCOUNTER — Ambulatory Visit: Payer: BC Managed Care – PPO | Admitting: Psychology

## 2021-10-14 DIAGNOSIS — F89 Unspecified disorder of psychological development: Secondary | ICD-10-CM
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
## 2021-10-21 ENCOUNTER — Ambulatory Visit: Payer: BC Managed Care – PPO | Admitting: Psychology

## 2021-10-21 DIAGNOSIS — F89 Unspecified disorder of psychological development: Secondary | ICD-10-CM

## 2021-10-21 NOTE — Progress Notes (Signed)
Date: 10/21/2021   Appointment Start Time: 9am Duration:  minutes Provider: Clarice Pole, PsyD Type of Session: Testing Appointment for Evaluation  Location of Patient: Home Location of Provider: Provider's Home (private office) Type of Contact: WebEx video visit with audio  Session Content: Today's appointment was a telepsychological visit due to COVID-19. Lawrence Mccarty is aware it is his responsibility to secure confidentiality on his end of the session. Prior to proceeding with today's appointment, Lawrence Mccarty's physical location at the time of this appointment was obtained as well a phone number he could be reached at in the event of technical difficulties. Lawrence Mccarty denied anyone else being present in the room or on the virtual appointment. This provider reviewed that video should not be captured, photos should not be taken, nor should testing stimuli be copied or recorded as it would be a copyright violation. Lawrence Mccarty expressed understanding of the aforementioned, and verbally consented to proceed. The WAIS-IV was administered, scored, and interpreted by this evaluator  Billing codes will be input on the feedback appointment. There are no billing codes for the testing appointment.   Provisional DSM-5 diagnosis(es):  F89 Unspecified Disorder of Psychological Development   Plan: Lawrence Mccarty was scheduled for a feedback appointment on 10/29/2021 at 11am via WebEx video visit with audio.                Dolores Lory, PsyD

## 2021-10-23 ENCOUNTER — Ambulatory Visit: Payer: BC Managed Care – PPO | Admitting: Psychology

## 2021-10-29 ENCOUNTER — Ambulatory Visit: Payer: BC Managed Care – PPO | Admitting: Psychology

## 2021-11-06 ENCOUNTER — Ambulatory Visit (INDEPENDENT_AMBULATORY_CARE_PROVIDER_SITE_OTHER): Payer: BC Managed Care – PPO | Admitting: Psychology

## 2021-11-06 DIAGNOSIS — F902 Attention-deficit hyperactivity disorder, combined type: Secondary | ICD-10-CM

## 2021-11-06 DIAGNOSIS — F411 Generalized anxiety disorder: Secondary | ICD-10-CM

## 2021-11-06 NOTE — Progress Notes (Signed)
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 self-report measure that assesses symptoms of anxiety. He scored 15/21, which indicates severe anxiety.   Adult ADHD Self-Report Scale Symptom Checklist (ASRS): Mr. Scalisi reported the following symptoms as sometimes: difficulty wrapping up final details of a project following the completion of challenging aspects and feeling overly active and compelled to do things. He endorsed the following symptoms as occurring often: difficulty getting things in order when a task requires organization and difficulty relaxing. He endorsed the following symptoms as very often: problems remembering appointments or obligations, avoiding or delaying getting started on tasks requiring a lot of thought, fidgeting or squirming, making careless mistakes when working on boring or difficult projects, struggling to sustain attention when doing boring or repetitive work, struggling to concentrate on what people say even when they are speaking directly to him, misplacing or has difficulty finding things, being distracted by noise around him, leaving his seat when expected to stay seated, feeling restless or fidgety, talking too much in social situations, interrupting others or finishing their sentences, difficulty waiting for turn in turn taking situations, and interrupting others when they are busy. Endorsement of at least four items in Part A is highly consistent with ADHD in adults. The frequency scores of Part B provides additional cues. Mr. Blissett scored a 5/6  on Part A and 12/12 on Part B, which is considered a positive screening for ADHD.   Personality Assessment Inventory (PAI): The PAI is an objective inventory of adult personality. The validity indicators suggest Mr. Nelles responded consistently to item content (ICN T = 43) and did not appear to portray himself in an overly positive (PIM T = 38) or negative (NIM T = 51) manner, although some of his responses were unusual (INF T= 67) which may at least partially be explained by trouble sustaining his attention for the duration of the PAI (MAN-A T = 85). As a result, his profile should be interpreted with caution. His profile is similar to individuals that are endorsing an accelerated activity level that may leave him confused and difficult to understand as well as contribute to attentional problems (MAN-A T = 85, DEP-P T = 67, ANX-P T = 61, SCZ-T T = 90), being impatient and easily frustrated (MAN-I T = 62), and a desire for stimulation and challenges (ANT-S T = 62) which may occasionally involve impulsivity in areas with a high potential for negative consequences (BOR-S T = 64 and BOR-A T = 62). He may be somewhat distant in personal relationships (Rosebud T = 37) and have a strong desire to control others and have little tolerance for those who disagree with his plans (DOM T = 70). He appears to be generally satisfied with himself and sees little need for significant changes in behaviors (RXR T = 55).     SUMMARY AND CLINICAL IMPRESSIONS: Mr. Hoadley is a 56 year old male who was referred by NP Lawrence Mccarty for an evaluation to determine if he currently meets criteria for a diagnosis of Attention-Deficit/Hyperactivity Disorder (ADHD).   Mr. Nehme reported he pursued an ADHD evaluation  secondary to experiencing various ADHD-related concerns that became exacerbated after switching to an employment position that has increased executive functioning-related demands; his son having been diagnosed with ADHD; and his  wife, that he noted is a Engineer, water, recommended he pursue an ADHD evaluation. He also described an extended history of [employer]-related concerns, generalized anxiety, sleep onset and maintenance issues that result in him regularly getting only 4-5 hours a night of sleep as his "mind does not stop," and periods of irritable and/or low mood throughout the day that he attributed to stress from trouble "getting stuff done on time" or "when things go wrong." He indicated his ADHD-related concerns are independent of mood.   During the evaluation, Mr. Ringwald was administered assessments to measure his current cognitive abilities. His verbal comprehension abilities were in the average range and similarly developed.  His ability to sustain attention, concentrate, and exert mental control was in the high average range; however, he performed much better on the Arithmetic subtest than on the Digit Span subtest, which may indicate a strength in arithmetic computational abilities. Moreover, Mr. Devereux demonstrated working memory-related issues on the [employer] (e.g., efforts to provide visuals to verbally presented information and repeating the problems and arithmetic computations out loud). On the CNS Vital Signs administration his results suggest he experiences verbal memory, psychomotor speed, reaction time, and motor speed impairment and a strength in complex attention.  During the clinical interview and on self-report measures, Mr. Celmer endorsed executive functioning impairment and attentional dysregulation, hyperactivity- and impulsivity-related symptoms, and meeting full criteria for ADHD. Moreover, his friend, Karrie Meres, indicated Mr. Bumbaugh experiences several executive functioning-related problems. While invalid test results make interpretation difficult, when considering self-reported symptoms; endorsed and/or demonstrated impairment on measures of executive functioning, working memory, psychomotor speed, and  reaction time; and a reported familial history of ADHD, a diagnosis of F90.2 Attention-Deficit/Hyperactivity Disorder, Combined Presentation, Moderate appears warranted. The specifier of "Moderate" was assigned as he endorsed more symptoms than needed to make the diagnosis and indicated they cause impairment in daily, social, academic, and occupational functioning. Mr. Quintela endorsed a history of generalized anxiety during the clinical interview and on a self-report measure (GAD-7). As a result, a diagnosis of F41.1 Generalized Anxiety Disorder appears warranted. Mr. Guttman also endorsed a history of [employer]-related concerns, sleep onset and maintenance issues, and periods of irritability and/or low mood throughout the day; however, given the limited meetings with Mr. Carstens and scope of this evaluation, it was unable to be determined if full criteria for these disorders are met and/or if the symptoms are better explained by diagnoses of ADHD and GAD. He would likely benefit from further evaluation of these symptoms to definitively rule in or out O[employer], depressive disorder, and a sleep-wake disorder.   DSM-5 Diagnostic Impressions: F90.2 Attention-Deficit/Hyperactivity Disorder, Combined Presentation, Moderate F41.1 Generalized Anxiety Disorder  RECOMMENDATIONS: Mr. Waren would likely benefit from making use of strategies for ADHD symptoms:  Setting a timer to complete tasks. Breaking tasks into manageable chunks and spreading them out over longer periods of time with breaks.  Utilizing lists and day calendars to keep track of tasks.  Answering emails daily.  Improving listening skills by asking the speaker to give information in smaller chunks and asking for explanation for clarification as needed. Leaving more than the anticipated time to complete tasks. It may help to keep tasks brief, well within your attention span, and a mix of both high and low interest tasks. Tasks may be gradually increased in  length. Practice proactive planning by setting aside time every evening to plan for the next day (e.g., prepare needed materials or pack the car the night before).  Learn how to make an effective and reasonable "to do" list of important tasks and priorities and always keep it easily accessible. Make additional copies in case it is lost or misplaced. Utilize visual reminders by posting appointments, "to do lists," or schedule in strategic areas at home and at work.  Practice using an appointment book, smart phone or other tech device, or a daily planning calendar, and learn to write down appointments and commitments immediately. Keep notepads or use a portable audio recorder to capture important ideas that would be beneficial to recall later. Learn and practice time management skills. Purchase a programmable alarm watch or set an alarm on smartphone to avoid losing track of time.  Use a color-coded Mccarty system, desk and closet organizers, storage boxes, or other organization devices to reduce clutter and improve efficiency and structure.  Implement ways to become more aware of your actions and to inhibit or adjust them as warranted (e.g., reviewing videos of your actions, consider consequences of obeying or not obeying the rules of various upcoming situations, have a trusted other to discuss plans with and/or provide cues to stop certain behaviors, and make visual cues for rules you would like to follow). Stay flexible and be prepared to change your plans as symptom breakthroughs and crises are likely to occur periodically. Mr. Kloosterman may benefit from mindfulness training to address symptoms of inattention.  Mr. Cartmell would likely benefit from a consultation regarding medication for ADHD symptoms.   Individual therapeutic services may assist in processing a diagnosis of ADHD and in improving coping skills for ADHD-related symptoms. Mental alertness/energy can be raised by increasing exercise; improving  sleep; eating a healthy diet; and managing stress. Consulting with a physician regarding any changes to physical regimen is recommended. "Failing at Normal: An ADHD Success Story" by Mellody Drown is a great overview of ADHD.  Organizations that are a good source of information on ADHD:  Children and Adults with Attention-Deficit/Hyperactivity Disorder (CHADD): chadd.org  Attention Deficit Disorder Association (ADDA): CondoFactory.com.cy ADD Resources: addresources.org ADD WareHouse: addwarehouse.com World Federation of ADHD: adhd-federation.org ADDConsults: https://www.hines.net/. Future evaluation if deemed necessary and/or to determine effectiveness of recommended interventions.   Lawrence Mccarty, Psy.D. Licensed Psychologist - [facility] #[id number]             Dolores Lory, PsyD

## 2021-11-15 ENCOUNTER — Telehealth: Payer: Self-pay | Admitting: Family

## 2021-11-15 ENCOUNTER — Encounter: Payer: Self-pay | Admitting: Family

## 2021-11-15 DIAGNOSIS — F902 Attention-deficit hyperactivity disorder, combined type: Secondary | ICD-10-CM | POA: Insufficient documentation

## 2021-11-15 NOTE — Telephone Encounter (Signed)
I received his ADHD evaluation which indicates that he does meet criteria for ADHD diagnosis.  I would like to see him for a follow up appointment to discuss treatment options.

## 2021-11-16 NOTE — Telephone Encounter (Signed)
Called but no answer, lvm for patient to call back for follow up appointment

## 2021-11-17 ENCOUNTER — Ambulatory Visit (INDEPENDENT_AMBULATORY_CARE_PROVIDER_SITE_OTHER): Payer: BC Managed Care – PPO | Admitting: Family

## 2021-11-17 VITALS — BP 135/75 | HR 71 | Temp 98.0°F | Resp 16 | Wt 182.0 lb

## 2021-11-17 DIAGNOSIS — F902 Attention-deficit hyperactivity disorder, combined type: Secondary | ICD-10-CM | POA: Diagnosis not present

## 2021-11-17 DIAGNOSIS — Z23 Encounter for immunization: Secondary | ICD-10-CM

## 2021-11-17 MED ORDER — AMPHETAMINE-DEXTROAMPHETAMINE 5 MG PO TABS: 5.0000 mg | ORAL_TABLET | Freq: Two times a day (BID) | ORAL | 0 refills | Status: DC

## 2021-11-17 NOTE — Progress Notes (Signed)
Subjective:   By signing my name below, I, Lawrence Mccarty, attest that this documentation has been prepared under the direction and in the presence of [name], NP 11/17/2021    Patient ID: Lawrence Mccarty, male    DOB: 06-22-1965, 56 y.o.   MRN: [id number]  Chief Complaint  Patient presents with   ADHD    New diagnosis, here to discuss treatment options    HPI Patient is in today for an office visit   ADHD: He had a formal evaluation on 09/25 for ADHD and reports a positive testing. He is interested in starting a medication.   Weight: He reports that he is improving his diet.  Wt Readings from Last 3 Encounters:  11/17/21 182 lb (82.6 kg)  09/08/21 178 lb (80.7 kg)  08/17/21 191 lb (86.6 kg)    Health Maintenance Due  Topic Date Due   COLONOSCOPY (Pts 45-18yr Insurance coverage will need to be confirmed)  Never done   COVID-19 Vaccine (3 - Pfizer risk series) 07/25/2019    Past Medical History:  Diagnosis Date   Amplified musculoskeletal pain    Attention deficit hyperactivity disorder, combined type, moderate    [facility]   Basal cell carcinoma 2004   nose   BASAL CELL CARCINOMA, HX OF 02/18/2010   Qualifier: Diagnosis of  By: OInda CastleFNP, Melissa S    Benign essential hypertension    Chronic headaches    Facial injury age 56  chain saw accident   GERD (gastroesophageal reflux disease) 11/24/2012   Hand crush injury 08/31/2014   HYPERCHOLESTEROLEMIA 09/06/2007   Qualifier: Diagnosis of  By: WRedmond PullingMD, LauraLee     Hyperglycemia    Hyperlipidemia    Mass    right wrist ulnar   Mouth sore 09/22/2011   Osgood-Schlatter's disease    right knee   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   Qualifier: Diagnosis of  By: WRedmond PullingMD, LauraLee     Rectal bleeding 07/08/2010   Right-sided chest pain 08/31/2014   SHOULDER PAIN, LEFT 02/18/2010   Qualifier: Diagnosis of  By: OInda CastleFNP, MLenna SciaraS     Past  Surgical History:  Procedure Laterality Date   BASAL CELL CARCINOMA EXCISION  2004   nose   COLONOSCOPY  07/14/2010   diverticulosis and internal hemorrhoids   left foot surgery  08/2021   Pin placement following fracture   TONSILLECTOMY     WRIST SURGERY  05/2005   ulnar mass removed    Family History  Problem Relation Age of Onset   Hyperlipidemia Mother    Hyperlipidemia Father        mother   Hypertension Father    Brain cancer Father        glioblastoma   Colon polyps Sister    Cancer Maternal Grandfather        throat, no-smoker   CAD Paternal Grandfather     Social History   Socioeconomic History   Marital status: Married    Spouse name: Not on file   Number of children: 2   Years of education: Not on file   Highest education level: Not on file  Occupational History   Occupation: MANAGER    Employer: [employer]  Tobacco Use   Smoking status: Never   Smokeless tobacco: Never  Vaping Use   Vaping Use: Never used  Substance and Sexual Activity   Alcohol use: Yes    Alcohol/week: 15.0 standard drinks  of alcohol    Types: 15 Standard drinks or equivalent per week    Comment: beer, wine   Drug use: No   Sexual activity: Yes    Partners: Female  Other Topics Concern   Not on file  Social History Narrative   Regular exercise:   No   Nurse, adult   Daughter- age 17 (moving to [place])   Son age 84   Enjoys boating and golfing   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 90 capsule 1   No facility-administered medications prior to visit.    No Known Allergies  ROS    See HPI Objective:    Physical Exam Constitutional:      General: He is not in acute distress.    Appearance: Normal appearance. He is not ill-appearing.  HENT:     Head: Normocephalic and atraumatic.     Right Ear: External ear normal.     Left Ear: External ear normal.  Eyes:     Extraocular Movements: Extraocular movements intact.     Pupils: Pupils are equal, round, and reactive to light.  Cardiovascular:     Rate and Rhythm: Normal rate.  Pulmonary:     Effort: Pulmonary effort is normal.  Skin:    General: Skin is warm and dry.  Neurological:     Mental Status: He is alert and oriented to person, place, and time.  Psychiatric:        Mood and Affect: Mood normal.        Behavior: Behavior normal.        Judgment: Judgment normal.     BP 135/75 (BP Location: Right Arm, Patient Position: Sitting, Cuff Size: Small)   Pulse 71   Temp 98 F (36.7 C) (Oral)   Resp 16   Wt 182 lb (82.6 kg)   SpO2 99%   BMI 26.11 kg/m  Wt Readings from Last 3 Encounters:  11/17/21 182 lb (82.6 kg)  09/08/21 178 lb (80.7 kg)  08/17/21 191 lb (86.6 kg)       Assessment & Plan:   Problem List Items Addressed This Visit       Unprioritized   Attention deficit hyperactivity disorder, combined type, moderate    Uncontrolled. Will initiate adderall '5mg'$  bid.  Reviewed [place] Controlled substance registry. Controlled substance contract is signed and UDS will be obtained. Follow up in 3 weeks for re-evaluation.       Other Visit Diagnoses     Needs flu shot    -  Primary   Relevant Orders   Flu Vaccine QUAD 6+ mos PF IM (Fluarix Quad PF) (Completed)      Meds ordered this encounter  Medications   amphetamine-dextroamphetamine (ADDERALL) 5 MG tablet    Sig: Take 1 tablet (5 mg total) by mouth 2 (two)  times daily with a meal.  Dispense:  60 tablet    Refill:  0    Order Specific Question:   Supervising Provider    Answer:   Penni Homans A [[id number]]    I, Nance Pear, NP, personally preformed the services described in this documentation.  All medical record entries made by the scribe were at my direction and in my presence.  I have reviewed the chart and discharge instructions (if applicable) and agree that the record reflects my personal performance and is accurate and complete. 11/17/2021   I,Amber Collins,acting as a scribe for Nance Pear, NP.,have documented all relevant documentation on the behalf of Nance Pear, NP,as directed by  Nance Pear, NP while in the presence of Nance Pear, NP.    Nance Pear, NP

## 2021-11-17 NOTE — Addendum Note (Signed)
Addended by: Manuela Schwartz on: 11/17/2021 10:51 AM   Modules accepted: Orders

## 2021-11-17 NOTE — Telephone Encounter (Signed)
Patient was seen today.

## 2021-11-17 NOTE — Assessment & Plan Note (Addendum)
New Diagnosis. Uncontrolled. Will initiate adderall '5mg'$  bid.  Reviewed [place] Controlled substance registry. Controlled substance contract is signed and UDS will be obtained. Follow up in 3 weeks for re-evaluation.

## 2021-11-19 ENCOUNTER — Telehealth: Payer: Self-pay

## 2021-11-19 LAB — DRUG MONITORING PANEL 376104, URINE
Amphetamines: NEGATIVE ng/mL (ref <500)
Barbiturates: NEGATIVE ng/mL (ref <300)
Benzodiazepines: NEGATIVE ng/mL (ref <100)
Cocaine Metabolite: NEGATIVE ng/mL (ref <150)
Desmethyltramadol: NEGATIVE ng/mL (ref <100)
Opiates: NEGATIVE ng/mL (ref <100)
Oxycodone: NEGATIVE ng/mL (ref <100)
Tramadol: NEGATIVE ng/mL (ref <100)

## 2021-11-19 LAB — DM TEMPLATE

## 2021-11-19 NOTE — Telephone Encounter (Signed)
Pt called to follow up on status of Adderall approval that was sent to [place] from his pharmacy. After reviewing s drive and chart, did not see where we had received it and advised that a note would be sent back to see where we are at in the process of getting this approved. Pt acknowledged understanding.

## 2021-11-19 NOTE — Telephone Encounter (Signed)
Caller Name [place] Phone Number [phone] Patient Name Lawrence Mccarty Patient DOB May 04, 2065 Call Type Message Only Information Provided Reason for Call Request for General Office Information Initial Comment Caller states his Adderall rx is still not approved. Disp. Time Disposition Final User 11/19/2021 7:42:00 AM General Information Provided Yes Kathlynn Grate Call Closed By: Kathlynn Grate Transaction Date/Time: 11/19/2021 7:40:24 AM (ET)

## 2021-11-20 ENCOUNTER — Telehealth: Payer: Self-pay

## 2021-11-20 NOTE — Telephone Encounter (Signed)
PA was initiated, waiting on determination

## 2021-11-20 NOTE — Telephone Encounter (Signed)
PA started in covermymeds Key # [id number] SENT TO PLAN TODAY, WAITING FOR DETERMINATION.

## 2021-11-23 NOTE — Telephone Encounter (Signed)
Medication was approved and patient verified he picked up

## 2021-11-26 ENCOUNTER — Encounter: Payer: Self-pay | Admitting: Internal Medicine

## 2021-12-08 ENCOUNTER — Ambulatory Visit: Payer: BC Managed Care – PPO | Admitting: Family

## 2021-12-08 ENCOUNTER — Other Ambulatory Visit (HOSPITAL_BASED_OUTPATIENT_CLINIC_OR_DEPARTMENT_OTHER): Payer: Self-pay

## 2021-12-08 VITALS — BP 127/77 | HR 69 | Temp 98.3°F | Resp 16 | Wt 182.0 lb

## 2021-12-08 DIAGNOSIS — F4323 Adjustment disorder with mixed anxiety and depressed mood: Secondary | ICD-10-CM

## 2021-12-08 MED ORDER — AMPHETAMINE-DEXTROAMPHET ER 20 MG PO CP24: 20.0000 mg | ORAL_CAPSULE | Freq: Every day | ORAL | 0 refills | Status: DC

## 2021-12-08 MED ORDER — AMPHETAMINE-DEXTROAMPHET ER 20 MG PO CP24
20.0000 mg | ORAL_CAPSULE | Freq: Every day | ORAL | 0 refills | Status: DC
  Filled 2021-12-08: qty 30, 30d supply, fill #0

## 2021-12-08 NOTE — Patient Instructions (Signed)
Please change adderall to XR '20mg'$  once daily.

## 2021-12-08 NOTE — Progress Notes (Signed)
Subjective:   By signing my name below, I, [name], attest that this documentation has been prepared under the direction and in the presence of [name], NP 12/08/2021    Patient ID: Lawrence Mccarty, male    DOB: 1965/08/05, 56 y.o.   MRN: [id number]  Chief Complaint  Patient presents with   ADHD    Here for follow u    HPI Patient is in today for an office visit  Adderall: Since last visit, he has started taking 5 mg of Adderall. He states that he takes a tablet at 07:30 in the morning and another one at 12:30 in the afternoon. For the first couple of days of taking the medication, he reports of feeling the effects but since then, the effects wore off for him. He states that his appetite is fine and his weight is stable. His sleep pattern is unchanged. He notes that it was difficult to receive the prescribed dosage of medication from the pharmacy.   Immunizations: He is UTD on his influenza vaccine.   Health Maintenance Due  Topic Date Due   COLONOSCOPY (Pts 45-31yr Insurance coverage will need to be confirmed)  Never done   COVID-19 Vaccine (3 - Pfizer risk series) 07/25/2019    Past Medical History:  Diagnosis Date   Amplified musculoskeletal pain    Attention deficit hyperactivity disorder, combined type, moderate    [facility]   Basal cell carcinoma 2004   nose   BASAL CELL CARCINOMA, HX OF 02/18/2010   Qualifier: Diagnosis of  By: OInda CastleFNP, Jorian [place]ho[place] S    Benign essential hypertension    Chronic headaches    Facial injury age 56  chain saw accident   GERD (gastroesophageal reflux disease) 11/24/2012   Hand crush injury 08/31/2014   HYPERCHOLESTEROLEMIA 09/06/2007   Qualifier: Diagnosis of  By: WRedmond PullingMD, LauraLee     Hyperglycemia    Hyperlipidemia    Mass    right wrist ulnar   Mouth sore 09/22/2011   Osgood-Schlatter's disease    right knee   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   Qualifier:  Diagnosis of  By: WRedmond PullingMD, LauraLee     Rectal bleeding 07/08/2010   Right-sided chest pain 08/31/2014   SHOULDER PAIN, LEFT 02/18/2010   Qualifier: Diagnosis of  By: OInda CastleFNP, MLenna SciaraS     Past Surgical History:  Procedure Laterality Date   BASAL CELL CARCINOMA EXCISION  2004   nose   COLONOSCOPY  07/14/2010   diverticulosis and internal hemorrhoids   left foot surgery  08/2021   Pin placement following fracture   TONSILLECTOMY     WRIST SURGERY  05/2005   ulnar mass removed    Family History  Problem Relation Age of Onset   Hyperlipidemia Mother    Hyperlipidemia Father        mother   Hypertension Father    Brain cancer Father        glioblastoma   Colon polyps Sister    Cancer Maternal Grandfather        throat, no-smoker   CAD Paternal Grandfather     Social History   Socioeconomic History   Marital status: Married    Spouse name: Not on file   Number of children: 2   Years of education: Not on file   Highest education level: Not on file  Occupational History   Occupation: MANAGER    Employer: [employer]  Use   Smoking status: Never   Smokeless tobacco: Never  Vaping Use   Vaping Use: Never used  Substance and Sexual Activity   Alcohol use: Yes    Alcohol/week: 15.0 standard drinks of alcohol    Types: 15 Standard drinks or equivalent per week    Comment: beer, wine   Drug use: No   Sexual activity: Yes    Partners: Female  Other Topics Concern   Not on file  Social History Narrative   Regular exercise:   No   Nurse, adult   Daughter- age 52 (moving to [place])   Son age 75   Enjoys boating and golfing   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 60 tablet 0   No facility-administered medications prior to visit.    No Known Allergies  ROS See HPI    Objective:    Physical Exam Vitals reviewed.  Constitutional:      General: He is not in acute distress.    Appearance: He is well-developed.  HENT:     Head: Normocephalic and atraumatic.  Cardiovascular:     Rate and Rhythm: Normal rate and regular rhythm.     Heart sounds: No murmur heard. Pulmonary:     Effort: Pulmonary effort is normal. No respiratory distress.     Breath sounds: Normal breath sounds. No wheezing or rales.  Skin:    General: Skin is warm and dry.  Neurological:     Mental Status: He is alert and oriented to person, place, and time.  Psychiatric:        Behavior: Behavior normal.        Thought Content: Thought content normal.     BP 127/77 (BP Location: Right Arm, Patient Position: Sitting, Cuff Size: Small)   Pulse 69   Temp 98.3 F (36.8 C) (Oral)   Resp 16   Wt 182 lb (82.6 kg)   SpO2 100%   BMI 26.11 kg/m  Wt Readings from Last 3 Encounters:  12/08/21 182 lb (82.6 kg)  11/17/21 182 lb (82.6 kg)  09/08/21 178 lb (80.7 kg)       Assessment & Plan:   Problem List Items Addressed This Visit       Unprioritized   Adjustment disorder with mixed anxiety and depressed mood - Primary    Some improvement on adderall '5mg'$  bid but only in the beginning.  Will change to adderall xr '20mg'$  once daily in the AM. Plan to follow back up in 1 month. BP and weight are stable.       Meds ordered this encounter  Medications   amphetamine-dextroamphetamine (ADDERALL XR) 20 MG 24 hr capsule    Sig: Take 1 capsule (20 mg total) by  mouth daily.    Dispense:  30 capsule    Refill:  0  Order Specific Question:   Supervising Provider    Answer:   Mosie Lukes [[id number]]    I, Nance Pear, NP, personally preformed the services described in this documentation.  All medical record entries made by the scribe were at my direction and in my presence.  I have reviewed the chart and discharge instructions (if applicable) and agree that the record reflects my personal performance and is accurate and complete. 12/08/2021   I,Amber Collins,acting as a scribe for Nance Pear, NP.,have documented all relevant documentation on the behalf of Nance Pear, NP,as directed by  Nance Pear, NP while in the presence of Nance Pear, NP.   Nance Pear, NP

## 2021-12-08 NOTE — Assessment & Plan Note (Signed)
Some improvement on adderall '5mg'$  bid but only in the beginning.  Will change to adderall xr '20mg'$  once daily in the AM. Plan to follow back up in 1 month. BP and weight are stable.

## 2021-12-09 ENCOUNTER — Encounter: Payer: Self-pay | Admitting: Family

## 2021-12-09 ENCOUNTER — Other Ambulatory Visit (HOSPITAL_BASED_OUTPATIENT_CLINIC_OR_DEPARTMENT_OTHER): Payer: Self-pay

## 2021-12-09 MED ORDER — AMPHETAMINE-DEXTROAMPHET ER 20 MG PO CP24
20.0000 mg | ORAL_CAPSULE | Freq: Every day | ORAL | 0 refills | Status: DC
  Filled 2021-12-09 (×2): qty 30, 30d supply, fill #0

## 2021-12-09 NOTE — Telephone Encounter (Signed)
PA denied.   The preferred drugs for your plan are: amphetamine-dextroamphetamine mixed salts ext-rel, dexmethylphenidate ext-rel,methylphenidate ext-rel, AZSTARYS.  Pharmacy needs a prescription for generic only.

## 2021-12-09 NOTE — Telephone Encounter (Signed)
Rx sent downstairs to pharmacy- pt notified. He will let me know if he has issues getting the generic filled.

## 2021-12-09 NOTE — Addendum Note (Signed)
Addended by: [name] on: 12/09/2021 12:49 PM   Modules accepted: Orders

## 2021-12-10 ENCOUNTER — Other Ambulatory Visit (HOSPITAL_BASED_OUTPATIENT_CLINIC_OR_DEPARTMENT_OTHER): Payer: Self-pay

## 2021-12-10 ENCOUNTER — Telehealth: Payer: Self-pay | Admitting: Family

## 2021-12-10 ENCOUNTER — Other Ambulatory Visit: Payer: Self-pay | Admitting: Family

## 2021-12-10 ENCOUNTER — Ambulatory Visit (AMBULATORY_SURGERY_CENTER): Payer: Self-pay

## 2021-12-10 VITALS — Ht 70.0 in | Wt 178.0 lb

## 2021-12-10 DIAGNOSIS — Z1211 Encounter for screening for malignant neoplasm of colon: Secondary | ICD-10-CM

## 2021-12-10 MED ORDER — AMPHETAMINE-DEXTROAMPHETAMINE 10 MG PO TABS
10.0000 mg | ORAL_TABLET | Freq: Two times a day (BID) | ORAL | 0 refills | Status: DC
  Filled 2021-12-10: qty 60, 30d supply, fill #0

## 2021-12-10 MED ORDER — DEXMETHYLPHENIDATE HCL ER 5 MG PO CP24
5.0000 mg | ORAL_CAPSULE | Freq: Every day | ORAL | 0 refills | Status: DC
  Filled 2021-12-10: qty 30, 30d supply, fill #0

## 2021-12-10 MED ORDER — PEG 3350-KCL-NA BICARB-NACL 420 G PO SOLR
4000.0000 mL | Freq: Once | ORAL | 0 refills | Status: AC
  Filled 2021-12-10: qty 4000, 1d supply, fill #0

## 2021-12-10 NOTE — Progress Notes (Signed)

## 2021-12-10 NOTE — Telephone Encounter (Signed)
PA initiated via Covermymeds; KEY: [id number]. Awaiting determination.

## 2021-12-11 ENCOUNTER — Other Ambulatory Visit (HOSPITAL_BASED_OUTPATIENT_CLINIC_OR_DEPARTMENT_OTHER): Payer: Self-pay

## 2021-12-13 NOTE — Telephone Encounter (Signed)
See mychart.  

## 2021-12-14 ENCOUNTER — Other Ambulatory Visit (HOSPITAL_BASED_OUTPATIENT_CLINIC_OR_DEPARTMENT_OTHER): Payer: Self-pay

## 2021-12-15 ENCOUNTER — Encounter: Payer: Self-pay | Admitting: Internal Medicine

## 2021-12-21 NOTE — Progress Notes (Unsigned)
[facility] History and Physical   Primary Care Physician:  [name], NP   Reason for Procedure:  Colon cancer screening  Plan:    Colonoscopy     HPI: Lawrence Mccarty is a 56 y.o. male status post colonoscopy 2012 with internal hemorrhoids and diverticulosis.  Presents for screening colonoscopy today.   Past Medical History:  Diagnosis Date   Amplified musculoskeletal pain    Attention deficit hyperactivity disorder, combined type, moderate    Formal Evaluation 9/23 @ [facility]   Basal cell carcinoma 2004   nose   BASAL CELL CARCINOMA, HX OF 02/18/2010   Qualifier: Diagnosis of  By: Inda Castle FNP, Melissa S    Benign essential hypertension    Chronic headaches    Facial injury age 55   chain saw accident   GERD (gastroesophageal reflux disease) 11/24/2012   Hand crush injury 08/31/2014   HYPERCHOLESTEROLEMIA 09/06/2007   Qualifier: Diagnosis of  By: Redmond Pulling MD, LauraLee     Hyperglycemia    Hyperlipidemia    Mass    right wrist ulnar   Mouth sore 09/22/2011   Osgood-Schlatter's disease    right knee   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   Qualifier: Diagnosis of  By: Redmond Pulling MD, LauraLee     Rectal bleeding 07/08/2010   Right-sided chest pain 08/31/2014   SHOULDER PAIN, LEFT 02/18/2010   Qualifier: Diagnosis of  By: Inda Castle FNP, Lenna Sciara S     Past Surgical History:  Procedure Laterality Date   BASAL CELL CARCINOMA EXCISION  2004   nose   COLONOSCOPY  07/14/2010   diverticulosis and internal hemorrhoids   left foot surgery  08/2021   Pin placement following fracture   TONSILLECTOMY     WRIST SURGERY  05/2005   ulnar mass removed    Prior to Admission medications   Medication Sig Start Date End Date Taking? Authorizing Provider  dexmethylphenidate (FOCALIN XR) 5 MG 24 hr capsule Take 1 capsule (5 mg total) by mouth daily. 12/10/21   [name], NP  valACYclovir (VALTREX) 1000 MG tablet Take one tab at the  start of cold sore and repeat in 12 hrs Patient not taking: Reported on 12/10/2021 08/13/20   [name], NP    Current Outpatient Medications  Medication Sig Dispense Refill   dexmethylphenidate (FOCALIN XR) 5 MG 24 hr capsule Take 1 capsule (5 mg total) by mouth daily. 30 capsule 0   valACYclovir (VALTREX) 1000 MG tablet Take one tab at the start of cold sore and repeat in 12 hrs (Patient not taking: Reported on 12/10/2021) 10 tablet 1   No current facility-administered medications for this visit.    Allergies as of 12/22/2021   (No Known Allergies)    Family History  Problem Relation Age of Onset   Hyperlipidemia Mother    Hyperlipidemia Father        mother   Hypertension Father    Brain cancer Father        glioblastoma   Colon polyps Sister    Cancer Maternal Grandfather        throat, no-smoker   CAD Paternal Grandfather    Colon cancer Neg Hx    Esophageal cancer Neg Hx    Rectal cancer Neg Hx    Stomach cancer Neg Hx     Social History   Socioeconomic History   Marital status: Married    Spouse name: Not on file   Number of children: 2   Years of  education: Not on file   Highest education level: Not on file  Occupational History   Occupation: MANAGER    Employer: [employer]  Tobacco Use   Smoking status: Never   Smokeless tobacco: Never  Vaping Use   Vaping Use: Never used  Substance and Sexual Activity   Alcohol use: Yes    Alcohol/week: 15.0 standard drinks of alcohol    Types: 15 Standard drinks or equivalent per week    Comment: beer, wine   Drug use: No   Sexual activity: Yes    Partners: Female  Other Topics Concern   Not on file  Social History Narrative   Regular exercise:   No   Nurse, adult   Daughter- age 52 (moving to [place])   Son age 59   Enjoys boating and golfing   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  Physical Exam: Vital signs There were no vitals taken for this visit.  General:   Alert,  Well-developed, well-nourished, pleasant and cooperative in NAD Lungs:  Clear throughout to auscultation.   Heart:  Regular rate and rhythm; no murmurs, clicks, rubs,  or gallops. Abdomen:  Soft, nontender and nondistended. Normal bowel sounds.   Neuro/Psych:  Alert and cooperative. Normal mood and affect. A and O x 3   '@[name]'$  Simonne Maffucci, MD, [facility] [phone] (pager) 12/21/2021 5:55 PM@

## 2021-12-22 ENCOUNTER — Encounter: Payer: Self-pay | Admitting: Internal Medicine

## 2021-12-22 ENCOUNTER — Ambulatory Visit (AMBULATORY_SURGERY_CENTER): Payer: BC Managed Care – PPO | Admitting: Internal Medicine

## 2021-12-22 VITALS — BP 127/63 | HR 71 | Temp 98.1°F | Resp 14 | Ht 70.0 in | Wt 178.0 lb

## 2021-12-22 DIAGNOSIS — Z1211 Encounter for screening for malignant neoplasm of colon: Secondary | ICD-10-CM | POA: Diagnosis not present

## 2021-12-22 MED ORDER — SODIUM CHLORIDE 0.9 % IV SOLN: 500.0000 mL | Freq: Once | INTRAVENOUS | Status: DC

## 2021-12-22 NOTE — Patient Instructions (Addendum)
No polyps or cancer were seen.  You do have diverticulosis - thickened muscle rings and pouches in the colon wall. Please read the handout about this condition.  Hemorrhoids remain as well.  Next routine colonoscopy or other screening test in 10 years - 2033.  Please resume normal diet and medications.  I appreciate the opportunity to care for you. Gatha Mayer, MD, [facility]  Handout on hemorrhoids and diverticulosis provided   YOU HAD AN ENDOSCOPIC PROCEDURE TODAY AT [facility]:   Refer to the procedure report that was given to you for any specific questions about what was found during the examination.  If the procedure report does not answer your questions, please call your gastroenterologist to clarify.  If you requested that your care partner not be given the details of your procedure findings, then the procedure report has been included in a sealed envelope for you to review at your convenience later.  YOU SHOULD EXPECT: Some feelings of bloating in the abdomen. Passage of more gas than usual.  Walking can help get rid of the air that was put into your GI tract during the procedure and reduce the bloating. If you had a lower endoscopy (such as a colonoscopy or flexible sigmoidoscopy) you may notice spotting of blood in your stool or on the toilet paper. If you underwent a bowel prep for your procedure, you may not have a normal bowel movement for a few days.  Please Note:  You might notice some irritation and congestion in your nose or some drainage.  This is from the oxygen used during your procedure.  There is no need for concern and it should clear up in a day or so.  SYMPTOMS TO REPORT IMMEDIATELY:  Following lower endoscopy (colonoscopy or flexible sigmoidoscopy):  Excessive amounts of blood in the stool  Significant tenderness or worsening of abdominal pains  Swelling of the abdomen that is new, acute  Fever of 100F or higher  For urgent or emergent issues, a  gastroenterologist can be reached at any hour by calling [phone]. Do not use MyChart messaging for urgent concerns.    DIET:  We do recommend a small meal at first, but then you may proceed to your regular diet.  Drink plenty of fluids but you should avoid alcoholic beverages for 24 hours.  ACTIVITY:  You should plan to take it easy for the rest of today and you should NOT DRIVE or use heavy machinery until tomorrow (because of the sedation medicines used during the test).    FOLLOW UP: Our staff will call the number listed on your records the next business day following your procedure.  We will call around 7:15- 8:00 am to check on you and address any questions or concerns that you may have regarding the information given to you following your procedure. If we do not reach you, we will leave a message.     If any biopsies were taken you will be contacted by phone or by letter within the next 1-3 weeks.  Please call us at [phone] if you have not heard about the biopsies in 3 weeks.    SIGNATURES/CONFIDENTIALITY: You and/or your care partner have signed paperwork which will be entered into your electronic medical record.  These signatures attest to the fact that that the information above on your After Visit Summary has been reviewed and is understood.  Full responsibility of the confidentiality of this discharge information lies with you and/or your  care-partner.

## 2021-12-22 NOTE — Progress Notes (Signed)
Pt's states no medical or surgical changes since previsit or office visit. VS assessed by D.T 

## 2021-12-22 NOTE — Op Note (Signed)
[facility] Patient Name: Enrrique Mierzwa Procedure Date: 12/22/2021 8:22 AM MRN: [id number] Endoscopist: Gatha Mayer , MD, [id number] Age: 56 Referring MD:  Date of Birth: Mar 07, 1965 Gender: Male Account #: [id number] Procedure:                Colonoscopy Indications:              Screening for colorectal malignant neoplasm, Last                            colonoscopy: 2012 Medicines:                Monitored Anesthesia Care Procedure:                Pre-Anesthesia Assessment:                           - Prior to the procedure, a History and Physical                            was performed, and patient medications and                            allergies were reviewed. The patient's tolerance of                            previous anesthesia was also reviewed. The risks                            and benefits of the procedure and the sedation                            options and risks were discussed with the patient.                            All questions were answered, and informed consent                            was obtained. Prior Anticoagulants: The patient has                            taken no anticoagulant or antiplatelet agents. ASA                            Grade Assessment: II - A patient with mild systemic                            disease. After reviewing the risks and benefits,                            the patient was deemed in satisfactory condition to                            undergo the procedure.  After obtaining informed consent, the colonoscope                            was passed under direct vision. Throughout the                            procedure, the patient's blood pressure, pulse, and                            oxygen saturations were monitored continuously. The                            CF [id number] #[id number] was introduced through the anus                            and advanced to the the cecum, identified  by                            appendiceal orifice and ileocecal valve. The                            colonoscopy was performed without difficulty. The                            patient tolerated the procedure well. The quality                            of the bowel preparation was good. The ileocecal                            valve, appendiceal orifice, and rectum were                            photographed. The bowel preparation used was                            GoLYTELY via split dose instruction. Scope In: 8:34:33 AM Scope Out: 8:47:38 AM Scope Withdrawal Time: 0 hours 11 minutes 2 seconds  Total Procedure Duration: 0 hours 13 minutes 5 seconds  Findings:                 The perianal and digital rectal examinations were                            normal. Pertinent negatives include normal prostate                            (size, shape, and consistency).                           Multiple diverticula were found in the sigmoid                            colon and descending colon.  External and internal hemorrhoids were found.                           The exam was otherwise without abnormality on                            direct and retroflexion views. Complications:            No immediate complications. Estimated Blood Loss:     Estimated blood loss: none. Impression:               - Diverticulosis in the sigmoid colon and in the                            descending colon.                           - External and internal hemorrhoids.                           - The examination was otherwise normal on direct                            and retroflexion views.                           - No specimens collected. Recommendation:           - Patient has a contact number available for                            emergencies. The signs and symptoms of potential                            delayed complications were discussed with the                             patient. Return to normal activities tomorrow.                            Written discharge instructions were provided to the                            patient.                           - Resume previous diet.                           - Continue present medications.                           - Repeat colonoscopy in 10 years for screening                            purposes. Gatha Mayer, MD 12/22/2021 8:57:13 AM This report has been signed electronically.

## 2021-12-22 NOTE — Progress Notes (Signed)
Report to pacu rn. Vss. Care resumed by rn. 

## 2021-12-23 ENCOUNTER — Telehealth: Payer: Self-pay

## 2021-12-23 NOTE — Telephone Encounter (Signed)
  Follow up Call-     12/22/2021    7:44 AM  Call back number  Post procedure Call Back phone  # [phone]  Permission to leave phone message Yes     Patient questions:  Do you have a fever, pain , or abdominal swelling? No. Pain Score  0 *  Have you tolerated food without any problems? Yes.    Have you been able to return to your normal activities? Yes.    Do you have any questions about your discharge instructions: Diet   No. Medications  No. Follow up visit  No.  Do you have questions or concerns about your Care? No.  Actions: * If pain score is 4 or above: No action needed, pain <4.

## 2022-01-04 ENCOUNTER — Other Ambulatory Visit (HOSPITAL_BASED_OUTPATIENT_CLINIC_OR_DEPARTMENT_OTHER): Payer: Self-pay

## 2022-01-04 ENCOUNTER — Ambulatory Visit (INDEPENDENT_AMBULATORY_CARE_PROVIDER_SITE_OTHER): Payer: BC Managed Care – PPO | Admitting: Family

## 2022-01-04 VITALS — BP 123/79 | HR 70 | Temp 97.7°F | Resp 16 | Wt 182.0 lb

## 2022-01-04 DIAGNOSIS — B009 Herpesviral infection, unspecified: Secondary | ICD-10-CM | POA: Diagnosis not present

## 2022-01-04 DIAGNOSIS — F902 Attention-deficit hyperactivity disorder, combined type: Secondary | ICD-10-CM | POA: Diagnosis not present

## 2022-01-04 MED ORDER — VALACYCLOVIR HCL 1 G PO TABS
ORAL_TABLET | ORAL | 1 refills | Status: DC
  Filled 2022-01-04: qty 10, 5d supply, fill #0

## 2022-01-04 MED ORDER — DEXMETHYLPHENIDATE HCL ER 15 MG PO CP24
15.0000 mg | ORAL_CAPSULE | Freq: Every day | ORAL | 0 refills | Status: DC
  Filled 2022-01-04: qty 30, 30d supply, fill #0

## 2022-01-04 NOTE — Assessment & Plan Note (Signed)
Recent outbreak- rx sent for valtrex.

## 2022-01-04 NOTE — Assessment & Plan Note (Signed)
No significant improvement on focalin xr '5mg'$ .  Will increase to '15mg'$  xr once daily. Follow up in 1 month.

## 2022-01-04 NOTE — Progress Notes (Signed)
Subjective:   By signing my name below, I, Lawrence Mccarty, attest that this documentation has been prepared under the direction and in the presence of Lawrence Mccarty, 01/04/2022.     Patient ID: Lawrence Mccarty, male    DOB: 25-Nov-1965, 56 y.o.   MRN: [id number]  Chief Complaint  Patient presents with   ADHD    Here for follow up    HPI Patient is in today for an office visit. He reports that he and his wife are doing much better and he is overall very happy with the way that his life is going.    ADHD- last visit his insurance would not cover adderall. We sent an rx for Focalin xr '5mg'$ .  He is tolerating without side effect but notes on a a brief improvement following each dose in his concentration.   Cold Sore- cousin recently committed suicide.  He has been stressed about this and recently developed an oral cold sore. Patient reports that he needs a refill on his 1000 mg Valtrex.   Health Maintenance Due  Topic Date Due   COVID-19 Vaccine (3 - Pfizer risk series) 07/25/2019    Past Medical History:  Diagnosis Date   Amplified musculoskeletal pain    Attention deficit hyperactivity disorder, combined type, moderate    Formal Evaluation 9/23 @ [facility]   Basal cell carcinoma 2004   nose   BASAL CELL CARCINOMA, HX OF 02/18/2010   Qualifier: Diagnosis of  By: Inda Castle FNP, Melissa S    Benign essential hypertension    Chronic headaches    Facial injury age 21   chain saw accident   GERD (gastroesophageal reflux disease) 11/24/2012   Hand crush injury 08/31/2014   HYPERCHOLESTEROLEMIA 09/06/2007   Qualifier: Diagnosis of  By: Redmond Pulling MD, LauraLee     Hyperglycemia    Hyperlipidemia    Mass    right wrist ulnar   Mouth sore 09/22/2011   Osgood-Schlatter's disease    right knee   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   Qualifier: Diagnosis of  By: Redmond Pulling MD, LauraLee     Rectal bleeding 07/08/2010   Right-sided chest pain 08/31/2014   SHOULDER PAIN,  LEFT 02/18/2010   Qualifier: Diagnosis of  By: Inda Castle FNP, Lenna Sciara S     Past Surgical History:  Procedure Laterality Date   BASAL CELL CARCINOMA EXCISION  2004   nose   COLONOSCOPY  07/14/2010   diverticulosis and internal hemorrhoids   left foot surgery  08/2021   Pin placement following fracture   TONSILLECTOMY     WRIST SURGERY  05/2005   ulnar mass removed    Family History  Problem Relation Age of Onset   Hyperlipidemia Mother    Hyperlipidemia Father        mother   Hypertension Father    Brain cancer Father        glioblastoma   Colon polyps Sister    Cancer Maternal Grandfather        throat, no-smoker   CAD Paternal Grandfather    Colon cancer Neg Hx    Esophageal cancer Neg Hx    Rectal cancer Neg Hx    Stomach cancer Neg Hx     Social History   Socioeconomic History   Marital status: Married    Spouse name: Not on file   Number of children: 2   Years of education: Not on file   Highest education level: Not on file  Occupational History  Occupation: Best boy: [employer]  Tobacco Use   Smoking status: Never   Smokeless tobacco: Never  Vaping Use   Vaping Use: Never used  Substance and Sexual Activity   Alcohol use: Yes    Alcohol/week: 15.0 standard drinks of alcohol    Types: 15 Standard drinks or equivalent per week    Comment: beer, wine   Drug use: No   Sexual activity: Yes    Partners: Female  Other Topics Concern   Not on file  Social History Narrative   Regular exercise:   No   Nurse, adult   Daughter- age 3 (moving to [place])   Son age 16   Enjoys boating and golfing   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 30 capsule 0   valACYclovir (VALTREX) 1000 MG tablet Take one tab at the start of cold sore and repeat in 12 hrs 10 tablet 1   No facility-administered medications prior to visit.    No Known Allergies  ROS See HPI    Objective:    Physical Exam Constitutional:      General: He is not in acute distress.    Appearance: Normal appearance. He is not ill-appearing.  HENT:     Head: Normocephalic and atraumatic.     Right Ear: External ear normal.     Left Ear: External ear normal.  Eyes:     Extraocular Movements: Extraocular movements intact.     Pupils: Pupils are equal, round, and reactive to light.  Cardiovascular:     Rate and Rhythm: Normal rate and regular rhythm.     Heart sounds: Normal heart sounds. No murmur heard.    No gallop.  Pulmonary:     Effort: Pulmonary effort is normal. No respiratory distress.     Breath sounds: Normal breath sounds. No wheezing or rales.  Skin:    General: Skin is warm and dry.     Comments: Cold sore noted right upper lip  Neurological:     Mental Status: He is alert and oriented to person, place, and time.  Psychiatric:        Mood and Affect: Mood normal.        Behavior: Behavior normal.        Judgment: Judgment normal.     BP 123/79 (BP Location: Right Arm, Patient Position: Sitting, Cuff Size: Small)   Pulse 70   Temp 97.7 F (36.5 C) (Oral)   Resp 16   Wt 182 lb (82.6 kg)   SpO2 100%   BMI 26.11 kg/m  Wt Readings from Last 3 Encounters:  01/04/22 182 lb (82.6 kg)  12/22/21 178 lb (80.7 kg)  12/10/21 178 lb (80.7 kg)       Assessment & Plan:   Problem List Items Addressed This Visit       Unprioritized   Herpes simplex type 1 infection    Recent outbreak- rx sent for valtrex.       Relevant  Medications   valACYclovir (VALTREX) 1000 MG tablet    Attention deficit hyperactivity disorder, combined type, moderate - Primary    No significant improvement on focalin xr '5mg'$ .  Will increase to '15mg'$  xr once daily. Follow up in 1 month.       Meds ordered this encounter  Medications   valACYclovir (VALTREX) 1000 MG tablet    Sig: Take one tab at the start of cold sore and repeat in 12 hrs    Dispense:  10 tablet    Refill:  1   dexmethylphenidate (FOCALIN XR) 15 MG 24 hr capsule    Sig: Take 1 capsule (15 mg total) by mouth daily.    Dispense:  30 capsule    Refill:  0    Order Specific Question:   Supervising Provider    Answer:   Penni Homans A [[id number]]    I, Lawrence Mccarty, personally preformed the services described in this documentation.  All medical record entries made by the scribe were at my direction and in my presence.  I have reviewed the chart and discharge instructions (if applicable) and agree that the record reflects my personal performance and is accurate and complete. 01/04/2022.   I,Verona Buck,acting as a Education administrator for [employer], NP.,have documented all relevant documentation on the behalf of Nance Pear, NP,as directed by  Nance Pear, NP while in the presence of Nance Pear, NP.      Nance Pear, NP

## 2022-01-05 ENCOUNTER — Other Ambulatory Visit (HOSPITAL_BASED_OUTPATIENT_CLINIC_OR_DEPARTMENT_OTHER): Payer: Self-pay

## 2022-01-05 ENCOUNTER — Telehealth: Payer: Self-pay

## 2022-01-05 NOTE — Telephone Encounter (Signed)
PA initiated via Covermymeds; KEY: [id number]. Awaiting determination.

## 2022-01-06 ENCOUNTER — Other Ambulatory Visit (HOSPITAL_BASED_OUTPATIENT_CLINIC_OR_DEPARTMENT_OTHER): Payer: Self-pay

## 2022-01-06 NOTE — Telephone Encounter (Signed)
PA approved. Effective 01/05/2022 - 01/05/2025

## 2022-02-03 ENCOUNTER — Ambulatory Visit: Payer: BC Managed Care – PPO | Admitting: Family

## 2022-02-03 ENCOUNTER — Telehealth: Payer: Self-pay

## 2022-02-03 VITALS — BP 124/72 | HR 68 | Resp 18 | Ht 70.0 in | Wt 181.0 lb

## 2022-02-03 DIAGNOSIS — F902 Attention-deficit hyperactivity disorder, combined type: Secondary | ICD-10-CM | POA: Diagnosis not present

## 2022-02-03 MED ORDER — AMPHETAMINE-DEXTROAMPHETAMINE 10 MG PO TABS: 10.0000 mg | ORAL_TABLET | Freq: Two times a day (BID) | ORAL | 0 refills | Status: DC

## 2022-02-03 NOTE — Progress Notes (Signed)
Subjective:   By signing my name below, I, Shehryar Baig, attest that this documentation has been prepared under the direction and in the presence of [name], NP. 02/03/2022   Patient ID: Lawrence Mccarty, male    DOB: 1965-05-30, 57 y.o.   MRN: [id number]  Chief Complaint  Patient presents with   Follow-up    HPI Patient is in today for a follow up visit.   His Focalin XR was increased to 15 mg during last visit and he noticed changes. He feels the effects for 0.5 hours before it wears off. He has tried 5 mg Adderall in the past and reports feeling more improvement while taking that compared to 15 mg Focalin XR.    Past Medical History:  Diagnosis Date   Amplified musculoskeletal pain    Attention deficit hyperactivity disorder, combined type, moderate    Formal Evaluation 9/23 @ [facility]   Basal cell carcinoma 2004   nose   BASAL CELL CARCINOMA, HX OF 02/18/2010   Qualifier: Diagnosis of  By: Inda Castle FNP, Gale Klar S    Benign essential hypertension    Chronic headaches    Facial injury age 80   chain saw accident   GERD (gastroesophageal reflux disease) 11/24/2012   Hand crush injury 08/31/2014   HYPERCHOLESTEROLEMIA 09/06/2007   Qualifier: Diagnosis of  By: Redmond Pulling MD, LauraLee     Hyperglycemia    Hyperlipidemia    Mass    right wrist ulnar   Mouth sore 09/22/2011   Osgood-Schlatter's disease    right knee   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   Qualifier: Diagnosis of  By: Redmond Pulling MD, LauraLee     Rectal bleeding 07/08/2010   Right-sided chest pain 08/31/2014   SHOULDER PAIN, LEFT 02/18/2010   Qualifier: Diagnosis of  By: Inda Castle FNP, Lenna Sciara S     Past Surgical History:  Procedure Laterality Date   BASAL CELL CARCINOMA EXCISION  2004   nose   COLONOSCOPY  07/14/2010   diverticulosis and internal hemorrhoids   left foot surgery  08/2021   Pin placement following fracture   TONSILLECTOMY     WRIST SURGERY  05/2005   ulnar  mass removed    Family History  Problem Relation Age of Onset   Hyperlipidemia Mother    Hyperlipidemia Father        mother   Hypertension Father    Brain cancer Father        glioblastoma   Colon polyps Sister    Cancer Maternal Grandfather        throat, no-smoker   CAD Paternal Grandfather    Colon cancer Neg Hx    Esophageal cancer Neg Hx    Rectal cancer Neg Hx    Stomach cancer Neg Hx     Social History   Socioeconomic History   Marital status: Married    Spouse name: Not on file   Number of children: 2   Years of education: Not on file   Highest education level: Not on file  Occupational History   Occupation: MANAGER    Employer: [employer]  Tobacco Use   Smoking status: Never   Smokeless tobacco: Never  Vaping Use   Vaping Use: Never used  Substance and Sexual Activity   Alcohol use: Yes    Alcohol/week: 15.0 standard drinks of alcohol    Types: 15 Standard drinks or equivalent per week    Comment: beer, wine   Drug use: No   Sexual  activity: Yes    Partners: Female  Other Topics Concern   Not on file  Social History Narrative   Regular exercise:   No   Nurse, adult   Daughter- age 43 (moving to [place])   Son age 58   Enjoys boating and golfing   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 30 capsule 0   valACYclovir (VALTREX) 1000 MG tablet Take one tablet by mouth at the start of cold sore and repeat in 12 hrs 10 tablet 1   No facility-administered medications prior to visit.    No Known  Allergies  ROS     Objective:    Physical Exam Constitutional:      General: He is not in acute distress.    Appearance: Normal appearance. He is not ill-appearing.  HENT:     Head: Normocephalic and atraumatic.     Right Ear: External ear normal.     Left Ear: External ear normal.  Eyes:     Extraocular Movements: Extraocular movements intact.  Cardiovascular:     Rate and Rhythm: Normal rate.  Pulmonary:     Effort: Pulmonary effort is normal.  Skin:    General: Skin is warm and dry.  Neurological:     Mental Status: He is alert and oriented to person, place, and time.  Psychiatric:        Mood and Affect: Mood normal.        Behavior: Behavior normal.        Thought Content: Thought content normal.     BP 124/72   Pulse 68   Resp 18   Ht '5\' 10"'$  (1.778 m)   Wt 181 lb (82.1 kg)   SpO2 100%   BMI 25.97 kg/m  Wt Readings from Last 3 Encounters:  02/03/22 181 lb (82.1 kg)  01/04/22 182 lb (82.6 kg)  12/22/21 178 lb (80.7 kg)       Assessment & Plan:  Attention deficit hyperactivity disorder, combined type, moderate Assessment & Plan: No significant improvement with focalin.  He did better on low dose adderall xr.  Since his insurance will not pay for adderall xr, will change to standard release adderall '10mg'$  bid. He will start with 1/2 tab twice daily on week one, then increase to 1 tab twice daily on week two as tolerated. I gave him a good Rx card to use for this purchase.    Other orders -     Amphetamine-Dextroamphetamine; Take 1 tablet (10 mg total) by mouth 2 (two) times daily.  Dispense: 60 tablet; Refill: 0    I, Nance Pear, NP, personally preformed the services described in this documentation.  All medical record entries made by the scribe were at my direction and in my presence.  I have reviewed the chart and discharge instructions (if applicable) and agree that the record reflects my personal performance and is accurate and complete.  02/03/2022   I,Shehryar Baig,acting as a scribe for Nance Pear, NP.,have  documented all relevant documentation on the behalf of Nance Pear, NP,as directed by  Nance Pear, NP while in the presence of Nance Pear, NP.   Nance Pear, NP

## 2022-02-03 NOTE — Telephone Encounter (Signed)
PA approved. Effective 02/03/2022 - 02/03/2025

## 2022-02-03 NOTE — Telephone Encounter (Signed)
PA initiated via Covermymeds; KEY: [id number]. Awaiting determination.

## 2022-02-03 NOTE — Assessment & Plan Note (Signed)
No significant improvement with focalin.  He did better on low dose adderall xr.  Since his insurance will not pay for adderall xr, will change to standard release adderall '10mg'$  bid. He will start with 1/2 tab twice daily on week one, then increase to 1 tab twice daily on week two as tolerated. I gave him a good Rx card to use for this purchase.

## 2022-03-08 ENCOUNTER — Ambulatory Visit: Payer: BC Managed Care – PPO | Admitting: Family

## 2022-03-08 ENCOUNTER — Other Ambulatory Visit (HOSPITAL_BASED_OUTPATIENT_CLINIC_OR_DEPARTMENT_OTHER): Payer: Self-pay

## 2022-03-08 ENCOUNTER — Telehealth: Payer: Self-pay

## 2022-03-08 VITALS — BP 127/75 | HR 65 | Temp 98.1°F | Resp 16 | Wt 179.0 lb

## 2022-03-08 DIAGNOSIS — F902 Attention-deficit hyperactivity disorder, combined type: Secondary | ICD-10-CM | POA: Diagnosis not present

## 2022-03-08 MED ORDER — AMPHETAMINE-DEXTROAMPHET ER 20 MG PO CP24
20.0000 mg | ORAL_CAPSULE | ORAL | 0 refills | Status: DC
  Filled 2022-03-08: qty 30, 30d supply, fill #0

## 2022-03-08 NOTE — Telephone Encounter (Signed)
PA initiated and sent to plan today for adderall  xr 20 mg

## 2022-03-08 NOTE — Progress Notes (Signed)
Subjective:     Patient ID: Lawrence Mccarty, male    DOB: [birth year], 57 y.o.   MRN: [id number]  No chief complaint on file.   HPI Patient is in today for follow up of his ADHD.  Last visit we discontinued focalin and instead began adderall '10mg'$  bid. Reports that he can feel it "for an hour" like "I drank 3 cups of coffee." His focus is great for that hour and then it wears off.    Health Maintenance Due  Topic Date Due   COVID-19 Vaccine (3 - Pfizer risk series) 07/25/2019    Past Medical History:  Diagnosis Date   Amplified musculoskeletal pain    Attention deficit hyperactivity disorder, combined type, moderate    Formal Evaluation 9/23 @ [facility]   Basal cell carcinoma 2004   nose   BASAL CELL CARCINOMA, HX OF 02/18/2010   Qualifier: Diagnosis of  By: Inda Castle FNP, Evann Erazo S    Benign essential hypertension    Chronic headaches    Facial injury age 42   chain saw accident   GERD (gastroesophageal reflux disease) 11/24/2012   Hand crush injury 08/31/2014   HYPERCHOLESTEROLEMIA 09/06/2007   Qualifier: Diagnosis of  By: Redmond Pulling MD, LauraLee     Hyperglycemia    Hyperlipidemia    Mass    right wrist ulnar   Mouth sore 09/22/2011   Osgood-Schlatter's disease    right knee   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   Qualifier: Diagnosis of  By: Redmond Pulling MD, LauraLee     Rectal bleeding 07/08/2010   Right-sided chest pain 08/31/2014   SHOULDER PAIN, LEFT 02/18/2010   Qualifier: Diagnosis of  By: Inda Castle FNP, Lenna Sciara S     Past Surgical History:  Procedure Laterality Date   BASAL CELL CARCINOMA EXCISION  2004   nose   COLONOSCOPY  07/14/2010   diverticulosis and internal hemorrhoids   left foot surgery  08/2021   Pin placement following fracture   TONSILLECTOMY     WRIST SURGERY  05/2005   ulnar mass removed    Family History  Problem Relation Age of Onset   Hyperlipidemia Mother    Hyperlipidemia Father        mother   Hypertension Father     Brain cancer Father        glioblastoma   Colon polyps Sister    Cancer Maternal Grandfather        throat, no-smoker   CAD Paternal Grandfather    Colon cancer Neg Hx    Esophageal cancer Neg Hx    Rectal cancer Neg Hx    Stomach cancer Neg Hx     Social History   Socioeconomic History   Marital status: Married    Spouse name: Not on file   Number of children: 2   Years of education: Not on file   Highest education level: Not on file  Occupational History   Occupation: MANAGER    Employer: [employer]  Tobacco Use   Smoking status: Never   Smokeless tobacco: Never  Vaping Use   Vaping Use: Never used  Substance and Sexual Activity   Alcohol use: Yes    Alcohol/week: 15.0 standard drinks of alcohol    Types: 15 Standard drinks or equivalent per week    Comment: beer, wine   Drug use: No   Sexual activity: Yes    Partners: Female  Other Topics Concern   Not on file  Social History Narrative  Regular exercise:   No   Nurse, adult   Daughter- age 4 (moving to [place])   Son age 88   Enjoys boating and golfing   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 60 tablet 0   dexmethylphenidate (FOCALIN XR) 15 MG 24 hr capsule Take 1 capsule (15 mg total) by mouth daily. (Patient not taking: Reported on 03/08/2022) 30 capsule 0   No facility-administered medications prior to visit.     No Known Allergies  ROS    See HPI Objective:    Physical Exam  BP 127/75 (BP Location: Right Arm, Patient Position: Sitting, Cuff Size: Small)   Pulse 65   Temp 98.1 F (36.7 C) (Oral)   Resp 16   Wt 179 lb (81.2 kg)   SpO2 100%   BMI 25.68 kg/m  Wt Readings from Last 3 Encounters:  03/08/22 179 lb (81.2 kg)  02/03/22 181 lb (82.1 kg)  01/04/22 182 lb (82.6 kg)    Gen: Awake, alert, no acute distress Resp: Breathing is even and non-labored Psych: calm/pleasant demeanor Neuro: Alert and Oriented x 3, + facial symmetry, speech is clear.     Assessment & Plan:   Problem List Items Addressed This Visit       Unprioritized   Attention deficit hyperactivity disorder, combined type, moderate - Primary    Uncontrolled on standard release adderall. Will change to Adderall xr '20mg'$ . Hopefully this will give him a longer stretch of focus.        I have discontinued Khiry Pasquariello. Thoen's dexmethylphenidate and amphetamine-dextroamphetamine. I am also having him start on amphetamine-dextroamphetamine. Additionally, I am having him maintain his valACYclovir.  Meds ordered this encounter  Medications   amphetamine-dextroamphetamine (ADDERALL XR) 20 MG 24 hr capsule    Sig: Take 1 capsule (20 mg total) by mouth every morning.    Dispense:  30 capsule    Refill:  0    Order Specific Question:   Supervising Provider    Answer:   Penni Homans A [[id number]]

## 2022-03-08 NOTE — Assessment & Plan Note (Signed)
Uncontrolled on standard release adderall. Will change to Adderall xr '20mg'$ . Hopefully this will give him a longer stretch of focus.

## 2022-03-09 ENCOUNTER — Other Ambulatory Visit (HOSPITAL_BASED_OUTPATIENT_CLINIC_OR_DEPARTMENT_OTHER): Payer: Self-pay

## 2022-03-10 ENCOUNTER — Other Ambulatory Visit (HOSPITAL_BASED_OUTPATIENT_CLINIC_OR_DEPARTMENT_OTHER): Payer: Self-pay

## 2022-03-12 ENCOUNTER — Ambulatory Visit: Payer: BC Managed Care – PPO | Admitting: Family

## 2022-03-12 ENCOUNTER — Other Ambulatory Visit (HOSPITAL_BASED_OUTPATIENT_CLINIC_OR_DEPARTMENT_OTHER): Payer: Self-pay

## 2022-04-08 ENCOUNTER — Encounter: Payer: Self-pay | Admitting: Family

## 2022-04-08 ENCOUNTER — Telehealth: Payer: Self-pay

## 2022-04-08 ENCOUNTER — Other Ambulatory Visit: Payer: Self-pay | Admitting: Family

## 2022-04-08 MED ORDER — AMPHETAMINE-DEXTROAMPHET ER 20 MG PO CP24: 20.0000 mg | ORAL_CAPSULE | ORAL | 0 refills | Status: DC

## 2022-04-08 NOTE — Telephone Encounter (Signed)
PA initiated via Covermymeds; KEY: [id number]. Awaiting determination.

## 2022-04-09 NOTE — Telephone Encounter (Signed)
PA approved. Effective 04/09/2022 - 04/08/2025

## 2022-04-22 ENCOUNTER — Ambulatory Visit: Payer: BC Managed Care – PPO | Admitting: Family

## 2022-04-22 VITALS — BP 125/72 | HR 67 | Temp 98.0°F | Resp 18 | Ht 70.0 in | Wt 175.0 lb

## 2022-04-22 DIAGNOSIS — F902 Attention-deficit hyperactivity disorder, combined type: Secondary | ICD-10-CM | POA: Diagnosis not present

## 2022-04-22 NOTE — Progress Notes (Signed)
Subjective:     Patient ID: Lawrence Mccarty, male    DOB: Jul 28, 1965, 57 y.o.   MRN: [id number]  Chief Complaint  Patient presents with   Follow-up    HPI Patient is in today for follow up of his ADHD.  Last visit we changed his adderall 10mg  bid to 20mg  Extended release once daily.  He reports good focus on this dose and is pleased.  It is now being covered by his insurance.   Health Maintenance Due  Topic Date Due   COVID-19 Vaccine (3 - Pfizer risk series) 07/25/2019   DTaP/Tdap/Td (3 - Td or Tdap) 03/19/2022    Past Medical History:  Diagnosis Date   Amplified musculoskeletal pain    Attention deficit hyperactivity disorder, combined type, moderate    Formal Evaluation 9/23 @ [facility]   Basal cell carcinoma 2004   nose   BASAL CELL CARCINOMA, HX OF 02/18/2010   Qualifier: Diagnosis of  By: Inda Castle FNP, Dezirea Mccollister S    Benign essential hypertension    Chronic headaches    Facial injury age 58   chain saw accident   GERD (gastroesophageal reflux disease) 11/24/2012   Hand crush injury 08/31/2014   HYPERCHOLESTEROLEMIA 09/06/2007   Qualifier: Diagnosis of  By: Redmond Pulling MD, LauraLee     Hyperglycemia    Hyperlipidemia    Mass    right wrist ulnar   Mouth sore 09/22/2011   Osgood-Schlatter's disease    right knee   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   Qualifier: Diagnosis of  By: Redmond Pulling MD, LauraLee     Rectal bleeding 07/08/2010   Right-sided chest pain 08/31/2014   SHOULDER PAIN, LEFT 02/18/2010   Qualifier: Diagnosis of  By: Inda Castle FNP, Lenna Sciara S     Past Surgical History:  Procedure Laterality Date   BASAL CELL CARCINOMA EXCISION  2004   nose   COLONOSCOPY  07/14/2010   diverticulosis and internal hemorrhoids   left foot surgery  08/2021   Pin placement following fracture   TONSILLECTOMY     WRIST SURGERY  05/2005   ulnar mass removed    Family History  Problem Relation Age of Onset   Hyperlipidemia Mother    Hyperlipidemia  Father        mother   Hypertension Father    Brain cancer Father        glioblastoma   Colon polyps Sister    Cancer Maternal Grandfather        throat, no-smoker   CAD Paternal Grandfather    Colon cancer Neg Hx    Esophageal cancer Neg Hx    Rectal cancer Neg Hx    Stomach cancer Neg Hx     Social History   Socioeconomic History   Marital status: Married    Spouse name: Not on file   Number of children: 2   Years of education: Not on file   Highest education level: Bachelor's degree (e.g., BA, AB, BS)  Occupational History   Occupation: MANAGER    Employer: [employer]  Tobacco Use   Smoking status: Never   Smokeless tobacco: Never  Vaping Use   Vaping Use: Never used  Substance and Sexual Activity   Alcohol use: Yes    Alcohol/week: 15.0 standard drinks of alcohol    Types: 15 Standard drinks or equivalent per week    Comment: beer, wine   Drug use: No   Sexual activity: Yes    Partners: Female  Other Topics Concern  Not on file  Social History Narrative   Regular exercise:   No   Nurse, adult   Daughter- age 73 (moving to [place])   Son age 31   Enjoys boating and golfing   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   2 cats   Social Determinants of Health   Financial Resource Strain: Low Risk  (04/22/2022)   Overall Financial Resource Strain (CARDIA)    Difficulty of Paying Living Expenses: Not hard at all  Food Insecurity: No Food Insecurity (04/22/2022)   Hunger Vital Sign    Worried About Running Out of Food in the Last Year: Never true    Ran Out of Food in the Last Year: Never true  Transportation Needs: No Transportation Needs (04/22/2022)   PRAPARE - Hydrologist (Medical): No    Lack of Transportation (Non-Medical): No  Physical Activity: Insufficiently Active (04/22/2022)   Exercise Vital Sign    Days of Exercise per Week: 3 days    Minutes of Exercise per Session: 20 min  Stress: Stress Concern Present  (04/22/2022)   [place]    Feeling of Stress : To some extent  Social Connections: Socially Integrated (04/22/2022)   Social Connection and Isolation Panel [NHANES]    Frequency of Communication with Friends and Family: More than three times a week    Frequency of Social Gatherings with Friends and Family: Twice a week    Attends Religious Services: More than 4 times per year    Active Member of [employer] or Organizations: Yes    Attends Music therapist: More than 4 times per year    Marital Status: Married  Human resources officer Violence: Not on file    Outpatient Medications Prior to Visit  Medication Sig Dispense Refill   amphetamine-dextroamphetamine (ADDERALL XR) 20 MG 24 hr capsule Take 1 capsule (20 mg total) by mouth every morning. 30 capsule 0   valACYclovir (VALTREX) 1000 MG tablet Take one tablet by mouth at the start of cold sore and repeat in 12 hrs 10 tablet 1   No facility-administered medications prior to visit.    No Known Allergies  ROS See HPI    Objective:    Physical Exam Constitutional:      Appearance: Normal appearance.  Eyes:     Extraocular Movements: Extraocular movements intact.  Cardiovascular:     Rate and Rhythm: Normal rate.  Pulmonary:     Effort: Pulmonary effort is normal.  Neurological:     Mental Status: He is alert.  Psychiatric:        Mood and Affect: Mood normal.        Behavior: Behavior normal.        Thought Content: Thought content normal.        Judgment: Judgment normal.     BP 125/72   Pulse 67   Temp 98 F (36.7 C)   Resp 18   Ht 5\' 10"  (1.778 m)   Wt 175 lb (79.4 kg)   SpO2 100%   BMI 25.11 kg/m  Wt Readings from Last 3 Encounters:  04/22/22 175 lb (79.4 kg)  03/08/22 179 lb (81.2 kg)  02/03/22 181 lb (82.1 kg)       Assessment & Plan:   Problem List Items Addressed This Visit       Unprioritized   Attention deficit  hyperactivity disorder, combined type, moderate - Primary  Stable on Adderall xr 20mg  once daily. Continue same.        I am having Lawrence Mccarty maintain his valACYclovir and amphetamine-dextroamphetamine.  No orders of the defined types were placed in this encounter.

## 2022-04-22 NOTE — Assessment & Plan Note (Signed)
Stable on Adderall xr 20mg  once daily. Continue same.

## 2022-05-09 ENCOUNTER — Other Ambulatory Visit: Payer: Self-pay | Admitting: Family

## 2022-05-10 MED ORDER — AMPHETAMINE-DEXTROAMPHET ER 20 MG PO CP24: 20.0000 mg | ORAL_CAPSULE | ORAL | 0 refills | Status: DC

## 2022-05-10 NOTE — Telephone Encounter (Signed)
Requesting: Adderall XR 20mg   Contract: 11/25/21 UDS: 11/17/21 Last Visit: 04/22/22 Next Visit: 07/23/22 Last Refill: 04/08/22 #30 and 0RF   Please Advise

## 2022-06-06 ENCOUNTER — Other Ambulatory Visit: Payer: Self-pay | Admitting: Family

## 2022-06-07 MED ORDER — AMPHETAMINE-DEXTROAMPHET ER 20 MG PO CP24: 20.0000 mg | ORAL_CAPSULE | ORAL | 0 refills | Status: DC

## 2022-06-07 NOTE — Telephone Encounter (Signed)
Requesting: Adderall XR 20mg  Contract: 11/17/21 UDS: 11/17/21 Last Visit: 04/22/22 Next Visit: 07/23/22 Last Refill: 05/10/22 #30 and 0RF   Please Advise

## 2022-07-12 ENCOUNTER — Other Ambulatory Visit: Payer: Self-pay | Admitting: Family

## 2022-07-12 MED ORDER — AMPHETAMINE-DEXTROAMPHET ER 20 MG PO CP24: 20.0000 mg | ORAL_CAPSULE | ORAL | 0 refills | Status: DC

## 2022-07-12 NOTE — Telephone Encounter (Signed)
Requesting: Adderall XR 20mg   Contract: 11/17/21 UDS: 11/17/21 Last Visit: 04/22/22 Next Visit: 07/23/22 Last Refill: 06/07/22 #30 and 0RF  Please Advise

## 2022-07-23 ENCOUNTER — Ambulatory Visit: Payer: BC Managed Care – PPO | Admitting: Family

## 2022-07-23 VITALS — BP 112/66 | HR 56 | Resp 18 | Ht 70.0 in | Wt 175.0 lb

## 2022-07-23 DIAGNOSIS — J302 Other seasonal allergic rhinitis: Secondary | ICD-10-CM | POA: Diagnosis not present

## 2022-07-23 DIAGNOSIS — F902 Attention-deficit hyperactivity disorder, combined type: Secondary | ICD-10-CM | POA: Diagnosis not present

## 2022-07-23 DIAGNOSIS — B009 Herpesviral infection, unspecified: Secondary | ICD-10-CM | POA: Diagnosis not present

## 2022-07-23 DIAGNOSIS — Z23 Encounter for immunization: Secondary | ICD-10-CM

## 2022-07-23 MED ORDER — VALACYCLOVIR HCL 1 G PO TABS: ORAL_TABLET | ORAL | 1 refills | Status: DC

## 2022-07-23 MED ORDER — AMPHETAMINE-DEXTROAMPHET ER 20 MG PO CP24: 20.0000 mg | ORAL_CAPSULE | ORAL | 0 refills | Status: DC

## 2022-07-23 NOTE — Assessment & Plan Note (Signed)
-  Continue Adderall 20mg  extended release daily. -Send refill to [facility] pharmacy on 08/09/2022.

## 2022-07-23 NOTE — Assessment & Plan Note (Signed)
Experiencing symptoms, managed with occasional Zyrtec. -Continue Zyrtec as needed.

## 2022-07-23 NOTE — Progress Notes (Signed)
Subjective:     Patient ID: Lawrence Mccarty, male    DOB: December 06, 1965, 57 y.o.   MRN: [id number]  Chief Complaint  Patient presents with   Follow-up    HPI  Discussed the use of AI scribe software for clinical note transcription with the patient, who gave verbal consent to proceed.  History of Present Illness   The patient, with a history of ADHD, presents for a medication check. He reports that the Adderall 20mg  extended release, taken around 9am, lasts until about 4-5pm, which aligns with his work schedule. He denies any side effects, but notes that he feels 'a little shakey' if he does not eat with the medication. His weight has remained stable at 175lbs, which he is content with. He reports improved dietary habits, including a reduction in snacking and a shift towards healthier food choices such as chicken, steak, and pork, and away from fast food and potatoes. He reports feeling better overall, with a reduction in blood pressure and less frequent feelings of hunger.  In addition, he reports seasonal allergies, which have been more bothersome since he started working outdoors more frequently in the past five years. He occasionally takes Zyrtec, which he finds helpful. He also mentions a recent cold sore, his first in two years, which responded well to Valtrex.           Health Maintenance Due  Topic Date Due   COVID-19 Vaccine (3 - Pfizer risk series) 07/25/2019   DTaP/Tdap/Td (3 - Td or Tdap) 03/19/2022    Past Medical History:  Diagnosis Date   Amplified musculoskeletal pain    Attention deficit hyperactivity disorder, combined type, moderate    Formal Evaluation 9/23 @ [facility]   Basal cell carcinoma 2004   nose   BASAL CELL CARCINOMA, HX OF 02/18/2010   Qualifier: Diagnosis of  By: Peggyann Juba FNP, Ree Alcalde S    Benign essential hypertension    Chronic headaches    Facial injury age 89   chain saw accident   GERD (gastroesophageal reflux disease)  11/24/2012   Hand crush injury 08/31/2014   HYPERCHOLESTEROLEMIA 09/06/2007   Qualifier: Diagnosis of  By: Andrey Campanile MD, LauraLee     Hyperglycemia    Hyperlipidemia    Mass    right wrist ulnar   Mouth sore 09/22/2011   Osgood-Schlatter's disease    right knee   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   Qualifier: Diagnosis of  By: Andrey Campanile MD, LauraLee     Rectal bleeding 07/08/2010   Right-sided chest pain 08/31/2014   SHOULDER PAIN, LEFT 02/18/2010   Qualifier: Diagnosis of  By: Peggyann Juba FNP, Efraim Kaufmann S     Past Surgical History:  Procedure Laterality Date   BASAL CELL CARCINOMA EXCISION  2004   nose   COLONOSCOPY  07/14/2010   diverticulosis and internal hemorrhoids   left foot surgery  08/2021   Pin placement following fracture   TONSILLECTOMY     WRIST SURGERY  05/2005   ulnar mass removed    Family History  Problem Relation Age of Onset   Hyperlipidemia Mother    Hyperlipidemia Father        mother   Hypertension Father    Brain cancer Father        glioblastoma   Colon polyps Sister    Cancer Maternal Grandfather        throat, no-smoker   CAD Paternal Grandfather    Colon cancer Neg Hx    Esophageal cancer  Neg Hx    Rectal cancer Neg Hx    Stomach cancer Neg Hx     Social History   Socioeconomic History   Marital status: Married    Spouse name: Not on file   Number of children: 2   Years of education: Not on file   Highest education level: Bachelor's degree (e.g., BA, AB, BS)  Occupational History   Occupation: MANAGER    Employer: [employer]  Tobacco Use   Smoking status: Never   Smokeless tobacco: Never  Vaping Use   Vaping Use: Never used  Substance and Sexual Activity   Alcohol use: Yes    Alcohol/week: 15.0 standard drinks of alcohol    Types: 15 Standard drinks or equivalent per week    Comment: beer, wine   Drug use: No   Sexual activity: Yes    Partners: Female  Other Topics Concern   Not on file  Social History Narrative   Regular  exercise:   No   Ambulance person   Daughter- age 59 (moving to [place])   Son age 37   Enjoys boating and golfing   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   2 cats   Social Determinants of Health   Financial Resource Strain: Low Risk  (04/22/2022)   Overall Financial Resource Strain (CARDIA)    Difficulty of Paying Living Expenses: Not hard at all  Food Insecurity: No Food Insecurity (04/22/2022)   Hunger Vital Sign    Worried About Running Out of Food in the Last Year: Never true    Ran Out of Food in the Last Year: Never true  Transportation Needs: No Transportation Needs (04/22/2022)   PRAPARE - Administrator, Civil Service (Medical): No    Lack of Transportation (Non-Medical): No  Physical Activity: Insufficiently Active (04/22/2022)   Exercise Vital Sign    Days of Exercise per Week: 3 days    Minutes of Exercise per Session: 20 min  Stress: Stress Concern Present (04/22/2022)   [employer] of Occupational Health - Occupational Stress Questionnaire    Feeling of Stress : To some extent  Social Connections: Socially Integrated (04/22/2022)   Social Connection and Isolation Panel [NHANES]    Frequency of Communication with Friends and Family: More than three times a week    Frequency of Social Gatherings with Friends and Family: Twice a week    Attends Religious Services: More than 4 times per year    Active Member of [employer] or Organizations: Yes    Attends Engineer, structural: More than 4 times per year    Marital Status: Married  Catering manager Violence: Not on file    Outpatient Medications Prior to Visit  Medication Sig Dispense Refill   amphetamine-dextroamphetamine (ADDERALL XR) 20 MG 24 hr capsule Take 1 capsule (20 mg total) by mouth every morning. 30 capsule 0   valACYclovir (VALTREX) 1000 MG tablet Take one tablet by mouth at the start of cold sore and repeat in 12 hrs 10 tablet 1   No facility-administered medications prior  to visit.    No Known Allergies  ROS See hpi    Objective:    Physical Exam Constitutional:      General: He is not in acute distress.    Appearance: He is well-developed.  HENT:     Head: Normocephalic and atraumatic.  Cardiovascular:     Rate and Rhythm: Normal rate and regular rhythm.     Heart sounds: No  murmur heard. Pulmonary:     Effort: Pulmonary effort is normal. No respiratory distress.     Breath sounds: Normal breath sounds. No wheezing or rales.  Skin:    General: Skin is warm and dry.  Neurological:     Mental Status: He is alert and oriented to person, place, and time.  Psychiatric:        Behavior: Behavior normal.        Thought Content: Thought content normal.      BP 112/66   Pulse (!) 56   Resp 18   Ht 5\' 10"  (1.778 m)   Wt 175 lb (79.4 kg)   SpO2 100%   BMI 25.11 kg/m  Wt Readings from Last 3 Encounters:  07/23/22 175 lb (79.4 kg)  04/22/22 175 lb (79.4 kg)  03/08/22 179 lb (81.2 kg)       Assessment & Plan:   Problem List Items Addressed This Visit       Unprioritized   Seasonal allergies    Experiencing symptoms, managed with occasional Zyrtec. -Continue Zyrtec as needed.       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 Valtrex effective in managing symptoms. -Refill Valtrex prescription.       Relevant Medications   valACYclovir (VALTREX) 1000 MG tablet   Attention deficit hyperactivity disorder, combined type, moderate    -Continue Adderall 20mg  extended release daily. -Send refill to [facility] pharmacy on 08/09/2022.      Controlled Substance contract is up to date and drug registry reviewed.   I am having Lovena Le maintain his amphetamine-dextroamphetamine and valACYclovir.  Meds ordered this encounter  Medications   amphetamine-dextroamphetamine (ADDERALL XR) 20 MG 24 hr capsule    Sig: Take 1 capsule (20 mg total) by mouth every morning.    Dispense:  30  capsule    Refill:  0    Please refill after 08/09/22    Order Specific Question:   Supervising Provider    Answer:   Danise Edge A [[id number]]   valACYclovir (VALTREX) 1000 MG tablet    Sig: Take one tablet by mouth at the start of cold sore and repeat in 12 hrs    Dispense:  10 tablet    Refill:  1    Order Specific Question:   Supervising Provider    Answer:   Danise Edge A [[id number]]

## 2022-07-23 NOTE — Patient Instructions (Signed)
VISIT SUMMARY:  During your visit, we discussed your ADHD, seasonal allergies, and a recent cold sore. You reported that your ADHD medication, Adderall, is working well for you and aligns with your work schedule. You also mentioned that your seasonal allergies have been more bothersome since you started walking outdoors more frequently, but Zyrtec helps manage the symptoms. You had a recent cold sore, which was your first in two years, and it responded well to Valtrex.  YOUR PLAN:  -ADHD: ADHD, or Attention Deficit Hyperactivity Disorder, is a condition that affects your ability to focus and control impulsive behaviors. We will continue your current medication, Adderall 20mg  extended release daily, as it seems to be working well for you. A refill will be sent to [facility] on 08/09/2022.  -COLD SORE: Cold sores, also known as Herpes Labialis, are small, painful, fluid-filled blisters that usually occur on or around the lips. They are caused by the herpes simplex virus. Your recent outbreak responded well to Valtrex, so we will refill this prescription for you.  -SEASONAL ALLERGIES: Seasonal allergies are allergic responses of your respiratory system to airborne substances, such as pollen or mold spores. You can continue to manage your symptoms with Zyrtec as needed.  INSTRUCTIONS:  Today, we administered a tetanus vaccine. Please schedule a follow-up visit in six months to continue monitoring your health and managing your conditions.

## 2022-07-23 NOTE — Assessment & Plan Note (Signed)
Recent outbreak, first in two years. Valtrex effective in managing symptoms. -Refill Valtrex prescription.

## 2022-08-19 ENCOUNTER — Other Ambulatory Visit: Payer: Self-pay | Admitting: Family

## 2022-08-19 MED ORDER — AMPHETAMINE-DEXTROAMPHET ER 20 MG PO CP24: 20.0000 mg | ORAL_CAPSULE | ORAL | 0 refills | Status: DC

## 2022-08-19 NOTE — Telephone Encounter (Signed)
Requesting: Adderall XR 20mg   Contract: 11/17/21 UDS: 11/17/21 Last Visit: 07/23/22 Next Visit: 01/24/23 Last Refill: 07/23/2022 #30 and 0RF  Please Advise

## 2022-09-18 ENCOUNTER — Other Ambulatory Visit: Payer: Self-pay | Admitting: Family

## 2022-09-20 NOTE — Telephone Encounter (Signed)
Requesting: Adderall XR 20mg   Contract:11/17/21 UDS: 11/17/21 Last Visit: 07/23/22 Next Visit: 01/24/23 Last Refill: 08/19/22 #30 and 0RF   Please Advise

## 2022-09-21 MED ORDER — AMPHETAMINE-DEXTROAMPHET ER 20 MG PO CP24: 20.0000 mg | ORAL_CAPSULE | ORAL | 0 refills | Status: DC

## 2022-10-22 ENCOUNTER — Other Ambulatory Visit: Payer: Self-pay | Admitting: Family

## 2022-10-22 MED ORDER — AMPHETAMINE-DEXTROAMPHET ER 20 MG PO CP24: 20.0000 mg | ORAL_CAPSULE | ORAL | 0 refills | Status: DC

## 2022-10-22 NOTE — Telephone Encounter (Signed)
Requesting: Adderall XR 20mg   Contract:11/17/21 UDS: 11/17/21 Last Visit: 07/23/22 Next Visit: 01/24/23 Last Refill: 09/21/2022 #30 and 0RF    Please Advise

## 2022-10-26 ENCOUNTER — Telehealth: Payer: Self-pay

## 2022-10-26 MED ORDER — DEXMETHYLPHENIDATE HCL ER 15 MG PO CP24
15.0000 mg | ORAL_CAPSULE | Freq: Every day | ORAL | 0 refills | Status: DC
  Filled 2022-10-26: qty 30, 30d supply, fill #0

## 2022-10-26 NOTE — Telephone Encounter (Signed)
Pt states that the is not able to get his adderall due to pharmacy is out. He states that he has called three different ones (even downstairs) and states this is due to a statewide shortage. Pt is asking if Melissa would send in Rx for meds he was taken back on 01/06/2022 15mg  24 hrs capsule for this month and then go back to adderall next month. Please advise.

## 2022-10-26 NOTE — Addendum Note (Signed)
Addended by: Sandford Craze on: 10/26/2022 07:19 PM   Modules accepted: Orders

## 2022-10-27 ENCOUNTER — Other Ambulatory Visit: Payer: Self-pay

## 2022-10-27 ENCOUNTER — Other Ambulatory Visit (HOSPITAL_BASED_OUTPATIENT_CLINIC_OR_DEPARTMENT_OTHER): Payer: Self-pay

## 2022-11-21 ENCOUNTER — Other Ambulatory Visit: Payer: Self-pay | Admitting: Family

## 2022-11-22 ENCOUNTER — Other Ambulatory Visit (HOSPITAL_BASED_OUTPATIENT_CLINIC_OR_DEPARTMENT_OTHER): Payer: Self-pay

## 2022-11-22 MED ORDER — DEXMETHYLPHENIDATE HCL ER 15 MG PO CP24
15.0000 mg | ORAL_CAPSULE | Freq: Every day | ORAL | 0 refills | Status: DC
  Filled 2022-11-22 – 2022-11-25 (×3): qty 30, 30d supply, fill #0

## 2022-11-24 ENCOUNTER — Other Ambulatory Visit (HOSPITAL_BASED_OUTPATIENT_CLINIC_OR_DEPARTMENT_OTHER): Payer: Self-pay

## 2022-11-25 ENCOUNTER — Other Ambulatory Visit (HOSPITAL_BASED_OUTPATIENT_CLINIC_OR_DEPARTMENT_OTHER): Payer: Self-pay

## 2022-12-24 ENCOUNTER — Encounter: Payer: Self-pay | Admitting: Family

## 2022-12-24 MED ORDER — AMPHETAMINE-DEXTROAMPHET ER 20 MG PO CP24: 20.0000 mg | ORAL_CAPSULE | ORAL | 0 refills | Status: DC

## 2022-12-24 NOTE — Telephone Encounter (Signed)
Pt states Adderall works better than focalin for him. Adderall xr sent to his pharmacy.

## 2023-01-21 ENCOUNTER — Other Ambulatory Visit: Payer: Self-pay | Admitting: Family

## 2023-01-21 MED ORDER — AMPHETAMINE-DEXTROAMPHET ER 20 MG PO CP24: 20.0000 mg | ORAL_CAPSULE | ORAL | 0 refills | Status: DC

## 2023-01-21 NOTE — Telephone Encounter (Signed)
Requesting: Adderall XR 20mg   Contract: 11/17/21 UDS: 11/17/21 Last Visit: 07/23/22 Next Visit: 02/14/23 Last Refill: 12/24/22 #30 and 0RF   Please Advise

## 2023-01-24 ENCOUNTER — Ambulatory Visit: Payer: BC Managed Care – PPO | Admitting: Family

## 2023-02-14 ENCOUNTER — Ambulatory Visit: Payer: 59 | Admitting: Family

## 2023-02-14 VITALS — BP 133/64 | HR 69 | Temp 98.6°F | Resp 16 | Ht 70.0 in | Wt 186.0 lb

## 2023-02-14 DIAGNOSIS — Z23 Encounter for immunization: Secondary | ICD-10-CM | POA: Diagnosis not present

## 2023-02-14 DIAGNOSIS — F902 Attention-deficit hyperactivity disorder, combined type: Secondary | ICD-10-CM

## 2023-02-14 MED ORDER — AMPHETAMINE-DEXTROAMPHETAMINE 5 MG PO TABS: ORAL_TABLET | ORAL | 0 refills | Status: DC

## 2023-02-14 MED ORDER — AMPHETAMINE-DEXTROAMPHET ER 20 MG PO CP24: 20.0000 mg | ORAL_CAPSULE | ORAL | 0 refills | Status: DC

## 2023-02-14 NOTE — Progress Notes (Signed)
 Subjective:     Patient ID: Lawrence Mccarty, male    DOB: Aug 27, 1965, 58 y.o.   MRN: [id number]  Chief Complaint  Patient presents with   ADHD    Here for follow up    HPI  Discussed the use of AI scribe software for clinical note transcription with the patient, who gave verbal consent to proceed.  History of Present Illness   The patient, with a history of ADHD, reports that the effects of his Adderall xr 20mg  seems to be weakening. He takes the medication around 8am and notices it wearing off around 3-4pm, which sometimes poses a problem in his work environment. His colleagues have noticed a change in his behavior when the medication wears off. The patient describes the effect of the medication as a 'light bulb being turned on' and notices a significant difference when it wears off. He also mentions that if he consumes too much caffeine, he experiences a tingling sensation, indicating that the medication dosage might be too high. The patient has been obtaining [facility], as other pharmacies have been out of stock. He also mentions a recent switch in his insurance plan from [employer] to the [employer]. The patient also reports a weight gain of 11 pounds since his last visit, attributing it to the holiday season and a lapse in his usual healthy eating habits.          Health Maintenance Due  Topic Date Due   Pneumococcal Vaccine 53-29 Years old (1 of 2 - PCV) Never done   COVID-19 Vaccine (3 - Pfizer risk series) 07/25/2019   INFLUENZA VACCINE  09/02/2022    Past Medical History:  Diagnosis Date   Amplified musculoskeletal pain    Attention deficit hyperactivity disorder, combined type, moderate    Formal Evaluation 9/23 @ [facility]   Basal cell carcinoma 2004   nose   BASAL CELL CARCINOMA, HX OF 02/18/2010   Qualifier: Diagnosis of  By: Daryl FNP, Josemanuel Eakins S    Benign essential hypertension    Chronic headaches     Facial injury age 58   chain saw accident   GERD (gastroesophageal reflux disease) 11/24/2012   Hand crush injury 08/31/2014   HYPERCHOLESTEROLEMIA 09/06/2007   Qualifier: Diagnosis of  By: Tanda MD, LauraLee     Hyperglycemia    Hyperlipidemia    Mass    right wrist ulnar   Mouth sore 09/22/2011   Osgood-Schlatter's disease    right knee   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   Qualifier: Diagnosis of  By: Tanda MD, LauraLee     Rectal bleeding 07/08/2010   Right-sided chest pain 08/31/2014   SHOULDER PAIN, LEFT 02/18/2010   Qualifier: Diagnosis of  By: Daryl FNP, Eleanor S     Past Surgical History:  Procedure Laterality Date   BASAL CELL CARCINOMA EXCISION  2004   nose   COLONOSCOPY  07/14/2010   diverticulosis and internal hemorrhoids   left foot surgery  08/2021   Pin placement following fracture   TONSILLECTOMY     WRIST SURGERY  05/2005   ulnar mass removed    Family History  Problem Relation Age of Onset   Hyperlipidemia Mother    Hyperlipidemia Father        mother   Hypertension Father    Brain cancer Father        glioblastoma   Colon polyps Sister    Cancer Maternal  Grandfather        throat, no-smoker   CAD Paternal Grandfather    Colon cancer Neg Hx    Esophageal cancer Neg Hx    Rectal cancer Neg Hx    Stomach cancer Neg Hx     Social History   Socioeconomic History   Marital status: Married    Spouse name: Not on file   Number of children: 2   Years of education: Not on file   Highest education level: Bachelor's degree (e.g., BA, AB, BS)  Occupational History   Occupation: MANAGER    Employer: [employer]  Tobacco Use   Smoking status: Never   Smokeless tobacco: Never  Vaping Use   Vaping status: Never Used  Substance and Sexual Activity   Alcohol use: Yes    Alcohol/week: 15.0 standard drinks of alcohol    Types: 15 Standard drinks or equivalent per week    Comment: beer, wine   Drug use: No   Sexual activity: Yes     Partners: Female  Other Topics Concern   Not on file  Social History Narrative   Regular exercise:   No   Ambulance Person   Daughter- age 78 (moving to [place])   Son age 34   Enjoys boating and golfing   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   2 cats   Social Drivers of Health   Financial Resource Strain: Low Risk  (04/22/2022)   Overall Financial Resource Strain (CARDIA)    Difficulty of Paying Living Expenses: Not hard at all  Food Insecurity: No Food Insecurity (04/22/2022)   Hunger Vital Sign    Worried About Running Out of Food in the Last Year: Never true    Ran Out of Food in the Last Year: Never true  Transportation Needs: No Transportation Needs (04/22/2022)   PRAPARE - Administrator, Civil Service (Medical): No    Lack of Transportation (Non-Medical): No  Physical Activity: Insufficiently Active (04/22/2022)   Exercise Vital Sign    Days of Exercise per Week: 3 days    Minutes of Exercise per Session: 20 min  Stress: Stress Concern Present (04/22/2022)   [employer] of Occupational Health - Occupational Stress Questionnaire    Feeling of Stress : To some extent  Social Connections: Socially Integrated (04/22/2022)   Social Connection and Isolation Panel [NHANES]    Frequency of Communication with Friends and Family: More than three times a week    Frequency of Social Gatherings with Friends and Family: Twice a week    Attends Religious Services: More than 4 times per year    Active Member of [employer] or Organizations: Yes    Attends Engineer, Structural: More than 4 times per year    Marital Status: Married  Catering Manager Violence: Not on file    Outpatient Medications Prior to Visit  Medication Sig Dispense Refill   valACYclovir  (VALTREX ) 1000 MG tablet Take one tablet by mouth at the start of cold sore and repeat in 12 hrs 10 tablet 1   amphetamine -dextroamphetamine  (ADDERALL XR) 20 MG 24 hr capsule Take 1 capsule (20 mg  total) by mouth every morning. 30 capsule 0   No facility-administered medications prior to visit.    No Known Allergies  ROS See HPI    Objective:    Physical Exam Constitutional:      Appearance: Normal appearance.  HENT:     Head: Normocephalic and atraumatic.  Cardiovascular:  Rate and Rhythm: Normal rate.  Pulmonary:     Effort: Pulmonary effort is normal.  Neurological:     Mental Status: He is alert and oriented to person, place, and time.  Psychiatric:        Mood and Affect: Mood normal.        Behavior: Behavior normal.        Thought Content: Thought content normal.        Judgment: Judgment normal.      BP 133/64   Pulse 69   Temp 98.6 F (37 C) (Oral)   Resp 16   Ht 5' 10 (1.778 m)   Wt 186 lb (84.4 kg)   SpO2 100%   BMI 26.69 kg/m  Wt Readings from Last 3 Encounters:  02/14/23 186 lb (84.4 kg)  07/23/22 175 lb (79.4 kg)  04/22/22 175 lb (79.4 kg)       Assessment & Plan:   Problem List Items Addressed This Visit       Unprioritized   Attention deficit hyperactivity disorder, combined type, moderate - Primary    Adderall XR efficacy waning in the afternoon, impacting work performance. Discussed the benefits/risks of adding a low dose of rapid release Adderall in the afternoon. -Add Adderall IR 5mg  in the afternoon. -Asked pt to send me a message in a few weeks to let me know how he is responding to this adjustment. Controlled substance contract is updated.   Will update UDS. Flu shot today, recommended covid booster at his pharmacy.        Relevant Orders   DRUG MONITORING, PANEL 8 WITH CONFIRMATION, URINE    I am having Lamar CHARLENA Garret start on amphetamine -dextroamphetamine . I am also having him maintain his valACYclovir  and amphetamine -dextroamphetamine .  Meds ordered this encounter  Medications   amphetamine -dextroamphetamine  (ADDERALL XR) 20 MG 24 hr capsule    Sig: Take 1 capsule (20 mg total) by mouth every morning.     Dispense:  30 capsule    Refill:  0    Supervising Provider:   DOMENICA BLACKBIRD A [[id number]]   amphetamine -dextroamphetamine  (ADDERALL) 5 MG tablet    Sig: In the afternoon    Dispense:  30 tablet    Refill:  0    Supervising Provider:   DOMENICA BLACKBIRD A [[id number]]

## 2023-02-14 NOTE — Assessment & Plan Note (Addendum)
  Adderall XR efficacy waning in the afternoon, impacting work performance. Discussed the benefits/risks of adding a low dose of rapid release Adderall in the afternoon. -Add Adderall IR 5mg  in the afternoon. -Asked pt to send me a message in a few weeks to let me know how he is responding to this adjustment. Controlled substance contract is updated.   Will update UDS. Flu shot today, recommended covid booster at his pharmacy.

## 2023-02-14 NOTE — Patient Instructions (Signed)
 VISIT SUMMARY:  During today's visit, we discussed your concerns about the effectiveness of your ADHD medication, Adderall XR, and its impact on your work performance. We also reviewed your recent weight gain and changes in your insurance plan. Additionally, we addressed general health maintenance, including vaccinations and scheduling a future physical.  YOUR PLAN:  -ADHD: ADHD, or Attention-Deficit/Hyperactivity Disorder, is a condition that affects focus, self-control, and other important skills. We discussed that your current medication, Adderall XR, seems to be wearing off in the afternoon. To help with this, we are adding a low dose of Adderall IR (5mg ) in the afternoon. Please monitor its effectiveness and we will reassess in a few weeks to make any necessary adjustments.  -GENERAL HEALTH MAINTENANCE: For your general health, we administered the influenza vaccine today. We also recommend getting a COVID-19 booster at your local pharmacy. Additionally, please schedule a physical exam in six months to ensure we continue to monitor your overall health.  INSTRUCTIONS:  Please take the newly prescribed Adderall IR 5mg  in the afternoon and monitor its effectiveness. We will check in a few weeks to see how it is working for you and adjust the dose if needed. Also, remember to get your COVID-19 booster at your local pharmacy and schedule a physical exam in six months.

## 2023-02-16 LAB — DRUG MONITORING, PANEL 8 WITH CONFIRMATION, URINE
6 Acetylmorphine: NEGATIVE ng/mL (ref <10)
Alcohol Metabolites: POSITIVE ng/mL — AB (ref <500)
Amphetamines: NEGATIVE ng/mL (ref <500)
Benzodiazepines: NEGATIVE ng/mL (ref <100)
Buprenorphine, Urine: NEGATIVE ng/mL (ref <5)
Cocaine Metabolite: NEGATIVE ng/mL (ref <150)
Creatinine: 91.3 mg/dL (ref >=20.0)
Ethyl Glucuronide (ETG): 12614 ng/mL — ABNORMAL HIGH (ref <500)
Ethyl Sulfate (ETS): 2855 ng/mL — ABNORMAL HIGH (ref <100)
MDMA: NEGATIVE ng/mL (ref <500)
Marijuana Metabolite: NEGATIVE ng/mL (ref <20)
Opiates: NEGATIVE ng/mL (ref <100)
Oxidant: NEGATIVE ug/mL (ref <200)
Oxycodone: NEGATIVE ng/mL (ref <100)
pH: 5.1 (ref 4.5–9.0)

## 2023-02-16 LAB — DM TEMPLATE

## 2023-02-17 ENCOUNTER — Encounter: Payer: Self-pay | Admitting: Family

## 2023-03-07 ENCOUNTER — Encounter: Payer: Self-pay | Admitting: Family

## 2023-03-08 ENCOUNTER — Other Ambulatory Visit (HOSPITAL_BASED_OUTPATIENT_CLINIC_OR_DEPARTMENT_OTHER): Payer: Self-pay

## 2023-03-08 MED ORDER — AMPHETAMINE-DEXTROAMPHETAMINE 5 MG PO TABS
5.0000 mg | ORAL_TABLET | Freq: Every day | ORAL | 0 refills | Status: DC
  Filled 2023-03-08: qty 30, fill #0
  Filled 2023-03-29: qty 30, 30d supply, fill #0

## 2023-03-08 MED ORDER — AMPHETAMINE-DEXTROAMPHET ER 20 MG PO CP24
20.0000 mg | ORAL_CAPSULE | ORAL | 0 refills | Status: DC
  Filled 2023-03-08 – 2023-03-29 (×2): qty 30, 30d supply, fill #0

## 2023-03-29 ENCOUNTER — Other Ambulatory Visit (HOSPITAL_BASED_OUTPATIENT_CLINIC_OR_DEPARTMENT_OTHER): Payer: Self-pay

## 2023-04-25 ENCOUNTER — Other Ambulatory Visit: Payer: Self-pay | Admitting: Family

## 2023-04-25 MED ORDER — AMPHETAMINE-DEXTROAMPHETAMINE 5 MG PO TABS: 5.0000 mg | ORAL_TABLET | Freq: Every day | ORAL | 0 refills | Status: DC

## 2023-04-25 MED ORDER — AMPHETAMINE-DEXTROAMPHET ER 20 MG PO CP24: 20.0000 mg | ORAL_CAPSULE | ORAL | 0 refills | Status: DC

## 2023-05-31 ENCOUNTER — Other Ambulatory Visit: Payer: Self-pay | Admitting: Family

## 2023-05-31 MED ORDER — AMPHETAMINE-DEXTROAMPHET ER 20 MG PO CP24: 20.0000 mg | ORAL_CAPSULE | ORAL | 0 refills | Status: DC

## 2023-05-31 MED ORDER — AMPHETAMINE-DEXTROAMPHETAMINE 5 MG PO TABS: 5.0000 mg | ORAL_TABLET | Freq: Every day | ORAL | 0 refills | Status: DC

## 2023-05-31 NOTE — Telephone Encounter (Signed)
 Requesting: Adderall XR 20 mg Contract: 02/14/2023 UDS: 02/14/2023 Last Visit: 02/14/2023 Next Visit: 08/17/2023 Last Refill: 04/25/2023  Please Advise    Requesting: Adderall 5 mg  Contract: 02/14/2023 UDS: 02/14/2023 Last Visit: 02/14/2023 Next Visit: 08/17/2023 Last Refill: 04/25/2023  Please Advise

## 2023-06-30 ENCOUNTER — Encounter: Payer: Self-pay | Admitting: Family

## 2023-06-30 ENCOUNTER — Other Ambulatory Visit (HOSPITAL_BASED_OUTPATIENT_CLINIC_OR_DEPARTMENT_OTHER): Payer: Self-pay

## 2023-06-30 MED ORDER — AMPHETAMINE-DEXTROAMPHET ER 20 MG PO CP24
20.0000 mg | ORAL_CAPSULE | ORAL | 0 refills | Status: DC
  Filled 2023-06-30: qty 30, 30d supply, fill #0

## 2023-06-30 MED ORDER — AMPHETAMINE-DEXTROAMPHETAMINE 5 MG PO TABS
5.0000 mg | ORAL_TABLET | Freq: Every day | ORAL | 0 refills | Status: DC
  Filled 2023-06-30: qty 30, 30d supply, fill #0

## 2023-06-30 NOTE — Telephone Encounter (Signed)
 Requesting: Adderall XR 20mg  and Adderall 5mg   Contract:02/14/23 UDS:02/14/23 Last Visit: 02/14/23 Next Visit: 08/17/23 Last Refill: see med list   Please Advise

## 2023-07-27 ENCOUNTER — Other Ambulatory Visit: Payer: Self-pay | Admitting: Family

## 2023-07-27 MED ORDER — AMPHETAMINE-DEXTROAMPHET ER 20 MG PO CP24
20.0000 mg | ORAL_CAPSULE | ORAL | 0 refills | Status: DC
Start: 2023-07-27 — End: 2023-08-29

## 2023-07-27 MED ORDER — AMPHETAMINE-DEXTROAMPHETAMINE 5 MG PO TABS
5.0000 mg | ORAL_TABLET | Freq: Every day | ORAL | 0 refills | Status: DC
Start: 2023-07-27 — End: 2023-08-29

## 2023-07-27 NOTE — Telephone Encounter (Signed)
 Requesting: Adderall XR 20mg  and Adderall 5mg   Contract:02/14/23 UDS:02/14/23 Last Visit: 02/14/23 Next Visit: 08/17/23 Last Refill: see med list   Please Advise

## 2023-08-17 ENCOUNTER — Ambulatory Visit (INDEPENDENT_AMBULATORY_CARE_PROVIDER_SITE_OTHER): Payer: 59 | Admitting: Family

## 2023-08-17 ENCOUNTER — Encounter: Payer: Self-pay | Admitting: Family

## 2023-08-17 VITALS — BP 127/73 | HR 57 | Temp 97.9°F | Resp 16 | Ht 70.0 in | Wt 185.6 lb

## 2023-08-17 DIAGNOSIS — Z0000 Encounter for general adult medical examination without abnormal findings: Secondary | ICD-10-CM

## 2023-08-17 DIAGNOSIS — Z23 Encounter for immunization: Secondary | ICD-10-CM

## 2023-08-17 DIAGNOSIS — Z125 Encounter for screening for malignant neoplasm of prostate: Secondary | ICD-10-CM

## 2023-08-17 DIAGNOSIS — R739 Hyperglycemia, unspecified: Secondary | ICD-10-CM | POA: Diagnosis not present

## 2023-08-17 LAB — HEMOGLOBIN A1C: Hgb A1c MFr Bld: 5.6 % (ref 4.6–6.5)

## 2023-08-17 LAB — PSA: PSA: 0.47 ng/mL (ref 0.10–4.00)

## 2023-08-17 LAB — COMPREHENSIVE METABOLIC PANEL WITH GFR
ALT: 18 U/L (ref 0–53)
AST: 17 U/L (ref 0–37)
Albumin: 4.6 g/dL (ref 3.5–5.2)
Alkaline Phosphatase: 49 U/L (ref 39–117)
BUN: 14 mg/dL (ref 6–23)
CO2: 28 meq/L (ref 19–32)
Calcium: 9.4 mg/dL (ref 8.4–10.5)
Chloride: 104 meq/L (ref 96–112)
Creatinine, Ser: 0.93 mg/dL (ref 0.40–1.50)
GFR: 91.28 mL/min (ref >60.00)
Glucose, Bld: 111 mg/dL — ABNORMAL HIGH (ref 70–99)
Potassium: 4.3 meq/L (ref 3.5–5.1)
Sodium: 140 meq/L (ref 135–145)
Total Bilirubin: 0.5 mg/dL (ref 0.2–1.2)
Total Protein: 6.7 g/dL (ref 6.0–8.3)

## 2023-08-17 NOTE — Patient Instructions (Signed)
 VISIT SUMMARY:  Today, you had your annual physical exam. We discussed your ADHD management, recent weight gain, increased alcohol consumption, and general health maintenance, including vaccinations and tests.  YOUR PLAN:  ATTENTION DEFICIT HYPERACTIVITY DISORDER (ADHD): Your ADHD symptoms are well-managed with your current Adderall prescription. -Continue taking Adderall as prescribed.  COLD SORES (HERPES LABIALIS): You had one cold sore in the past year, which was effectively treated with Valtrex . -Continue to use Valtrex  as needed for cold sores.  LIFESTYLE AND DIETARY HABITS: You have gained five pounds due to increased carbohydrate intake and have not engaged in formal exercise despite being active at work. -Reduce carbohydrate intake and increase consumption of vegetables and proteins. -Resume formal exercise, possibly using your [employer].  ALCOHOL CONSUMPTION: You reported a slight increase in alcohol consumption over the summer. -Be mindful of your alcohol intake and try to limit it.  GENERAL HEALTH MAINTENANCE: You are due for some vaccinations and need to monitor your PSA and blood sugar levels. -Administer pneumonia vaccine today. -Administer hepatitis B vaccine today. -Schedule a nurse visit for the second dose of the hepatitis B vaccine in 2 to 6 months. -Order PSA test to monitor prostate health. -Order diabetes test to monitor blood sugar levels.  FOLLOW-UP: We need to reassess your health status in six months. -Schedule a follow-up appointment in six months.

## 2023-08-17 NOTE — Progress Notes (Signed)
 Subjective:     Patient ID: Lawrence Mccarty, male    DOB: 16-Sep-1965, 58 y.o.   MRN: [id number]  Chief Complaint  Patient presents with   Annual Exam    No concerns and patient is fasting     HPI  Discussed the use of AI scribe software for clinical note transcription with the patient, who gave verbal consent to proceed.  History of Present Illness  Lawrence Mccarty is a 58 year old male who presents for an annual physical exam.  He takes Adderall for ADHD, which is effective. His medication contract was updated in January.  He has gained five pounds since his last visit, attributing this to increased carbohydrate intake. He is active at work but has not engaged in formal exercise for the past year, despite being a member of [employer].  He reports increased alcohol consumption over the summer, primarily Michel[employer] Ultra, due to the installation of an outdoor refrigerator and grill by the pool. He does not consume whiskey, drugs, tobacco, or vape.  He had a colonoscopy in 2023 and is up to date with eye and dental exams, visiting the dentist twice a year with no cavities since age 71.  He had one cold sore in the last year, effectively treated with Valtrex , and still has over half a bottle remaining. He has no current cough, cold symptoms, skin concerns, hearing or vision issues, digestive problems, urinary issues, unusual muscle or joint pain, frequent headaches, or concerns about depression or anxiety.  Immunizations: due for heplisav B and prevnar 20  Diet: has gained some weight, trying to cut back on carbs Exercise: no formal exercise, active in his job Colonoscopy: 2023- due 2033 Vision: up to date Dental: up to date     Health Maintenance Due  Topic Date Due   Pneumococcal Vaccine 16-54 Years old (1 of 2 - PCV) Never done   Hepatitis B Vaccines (1 of 3 - 19+ 3-dose series) Never done   COVID-19 Vaccine (3 - Pfizer risk series) 07/25/2019    Past Medical  History:  Diagnosis Date   Amplified musculoskeletal pain    Attention deficit hyperactivity disorder, combined type, moderate    Formal Evaluation 9/23 @ [facility]   Basal cell carcinoma 2004   nose   BASAL CELL CARCINOMA, HX OF 02/18/2010   Qualifier: Diagnosis of  By: Daryl FNP, Deone Leifheit S    Benign essential hypertension    Chronic headaches    Facial injury age 58   chain saw accident   GERD (gastroesophageal reflux disease) 11/24/2012   Hand crush injury 08/31/2014   HYPERCHOLESTEROLEMIA 09/06/2007   Qualifier: Diagnosis of  By: Tanda MD, LauraLee     Hyperglycemia    Hyperlipidemia    Mass    right wrist ulnar   Mouth sore 09/22/2011   Osgood-Schlatter's disease    right knee   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   Qualifier: Diagnosis of  By: Tanda MD, LauraLee     Rectal bleeding 07/08/2010   Right-sided chest pain 08/31/2014   SHOULDER PAIN, LEFT 02/18/2010   Qualifier: Diagnosis of  By: Daryl FNP, Eleanor S     Past Surgical History:  Procedure Laterality Date   BASAL CELL CARCINOMA EXCISION  2004   nose   COLONOSCOPY  07/14/2010   diverticulosis and internal hemorrhoids   left foot surgery  08/2021   Pin placement following fracture   TONSILLECTOMY     WRIST SURGERY  05/2005  ulnar mass removed    Family History  Problem Relation Age of Onset   Hyperlipidemia Mother    Hyperlipidemia Father        mother   Hypertension Father    Brain cancer Father        glioblastoma   Colon polyps Sister    Cancer Maternal Grandfather        throat, no-smoker   CAD Paternal Grandfather    Colon cancer Neg Hx    Esophageal cancer Neg Hx    Rectal cancer Neg Hx    Stomach cancer Neg Hx     Social History   Socioeconomic History   Marital status: Married    Spouse name: Not on file   Number of children: 2   Years of education: Not on file   Highest education level: Bachelor's degree (e.g., BA, AB, BS)  Occupational History    Occupation: MANAGER    Employer: [employer]  Tobacco Use   Smoking status: Never   Smokeless tobacco: Never  Vaping Use   Vaping status: Never Used  Substance and Sexual Activity   Alcohol use: Yes    Alcohol/week: 15.0 standard drinks of alcohol    Types: 15 Standard drinks or equivalent per week    Comment: beer, wine   Drug use: No   Sexual activity: Yes    Partners: Female  Other Topics Concern   Not on file  Social History Narrative   Regular exercise:   No   Ambulance person   Daughter- age 49 (moving to [place])   Son age 13   Enjoys boating and golfing   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   2 cats   Social Drivers of Health   Financial Resource Strain: Low Risk  (08/16/2023)   Overall Financial Resource Strain (CARDIA)    Difficulty of Paying Living Expenses: Not hard at all  Food Insecurity: No Food Insecurity (08/16/2023)   Hunger Vital Sign    Worried About Running Out of Food in the Last Year: Never true    Ran Out of Food in the Last Year: Never true  Transportation Needs: No Transportation Needs (08/16/2023)   PRAPARE - Administrator, Civil Service (Medical): No    Lack of Transportation (Non-Medical): No  Physical Activity: Insufficiently Active (08/16/2023)   Exercise Vital Sign    Days of Exercise per Week: 2 days    Minutes of Exercise per Session: 30 min  Stress: No Stress Concern Present (08/16/2023)   [employer] of Occupational Health - Occupational Stress Questionnaire    Feeling of Stress: Only a little  Social Connections: Moderately Integrated (08/16/2023)   Social Connection and Isolation Panel    Frequency of Communication with Friends and Family: More than three times a week    Frequency of Social Gatherings with Friends and Family: Once a week    Attends Religious Services: 1 to 4 times per year    Active Member of [employer] or Organizations: No    Attends Engineer, structural: Not on file    Marital  Status: Married  Catering manager Violence: Not on file    Outpatient Medications Prior to Visit  Medication Sig Dispense Refill   amphetamine -dextroamphetamine  (ADDERALL XR) 20 MG 24 hr capsule Take 1 capsule (20 mg total) by mouth every morning. 30 capsule 0   amphetamine -dextroamphetamine  (ADDERALL) 5 MG tablet Take 1 tablet (5 mg total) by mouth daily in the afternoon. 30 tablet 0  valACYclovir  (VALTREX ) 1000 MG tablet Take one tablet by mouth at the start of cold sore and repeat in 12 hrs 10 tablet 1   No facility-administered medications prior to visit.    No Known Allergies  Review of Systems  Constitutional:  Negative for weight loss.  HENT:  Negative for congestion and hearing loss.   Eyes:  Negative for blurred vision.  Respiratory:  Negative for cough.   Cardiovascular:  Negative for leg swelling.  Gastrointestinal:  Negative for constipation and diarrhea.  Genitourinary:  Negative for dysuria and frequency.  Musculoskeletal:  Negative for joint pain and myalgias.  Skin:  Negative for rash.  Neurological:  Negative for headaches.  Psychiatric/Behavioral:  Negative for depression. The patient is not nervous/anxious.       See HPI Objective:    Physical Exam   BP 127/73   Pulse (!) 57   Temp 97.9 F (36.6 C) (Oral)   Resp 16   Ht 5' 10 (1.778 m)   Wt 185 lb 9.6 oz (84.2 kg)   SpO2 100%   BMI 26.63 kg/m  Wt Readings from Last 3 Encounters:  08/17/23 185 lb 9.6 oz (84.2 kg)  02/14/23 186 lb (84.4 kg)  07/23/22 175 lb (79.4 kg)  Physical Exam  Constitutional: He is oriented to person, place, and time. He appears well-developed and well-nourished. No distress.  HENT:  Head: Normocephalic and atraumatic.  Right Ear: Tympanic membrane and ear canal normal.  Left Ear: Tympanic membrane and ear canal normal.  Mouth/Throat: Oropharynx is clear and moist.  Eyes: Pupils are equal, round, and reactive to light. No scleral icterus.  Neck: Normal range of motion.  No thyromegaly present.  Cardiovascular: Normal rate and regular rhythm.   No murmur heard. Pulmonary/Chest: Effort normal and breath sounds normal. No respiratory distress. He has no wheezes. He has no rales. He exhibits no tenderness.  Abdominal: Soft. Bowel sounds are normal. He exhibits no distension and no mass. There is no tenderness. There is no rebound and no guarding.  Musculoskeletal: He exhibits no edema.  Lymphadenopathy:    He has no cervical adenopathy.  Neurological: He is alert and oriented to person, place, and time. He has normal patellar reflexes. He exhibits normal muscle tone. Coordination normal.  Skin: Skin is warm and dry.  Psychiatric: He has a normal mood and affect. His behavior is normal. Judgment and thought content normal.           Assessment & Plan:        Assessment & Plan:   Problem List Items Addressed This Visit       Unprioritized   Preventative health care - Primary    Weight gain due to increased carbohydrate intake. Physically active but lacks formal exercise. Aware of dietary balance needs. - Encourage reduction of carbohydrate intake and increase in vegetable and protein consumption.  General Health Maintenance Due for vaccinations. Consents to pneumonia and hepatitis B vaccines. Monitoring PSA and blood sugar due to slightly elevated readings. - Administer pneumonia vaccine. - Administer hepatitis B vaccine. - Order PSA test. - Order diabetes test to monitor blood sugar levels. - Schedule nurse visit for second hepatitis B vaccine dose in 2 to 6 months.      Hyperglycemia   Relevant Orders   Comp Met (CMET)   HgB A1c   Other Visit Diagnoses       Screening for prostate cancer       Relevant Orders   PSA  I am having Lawrence Mccarty CHARLENA Garret maintain his valACYclovir , amphetamine -dextroamphetamine , and amphetamine -dextroamphetamine .  No orders of the defined types were placed in this encounter.

## 2023-08-17 NOTE — Assessment & Plan Note (Signed)
  Weight gain due to increased carbohydrate intake. Physically active but lacks formal exercise. Aware of dietary balance needs. - Encourage reduction of carbohydrate intake and increase in vegetable and protein consumption.  General Health Maintenance Due for vaccinations. Consents to pneumonia and hepatitis B vaccines. Monitoring PSA and blood sugar due to slightly elevated readings. - Administer pneumonia vaccine. - Administer hepatitis B vaccine. - Order PSA test. - Order diabetes test to monitor blood sugar levels. - Schedule nurse visit for second hepatitis B vaccine dose in 2 to 6 months.

## 2023-08-18 ENCOUNTER — Ambulatory Visit: Payer: Self-pay | Admitting: Family

## 2023-08-28 ENCOUNTER — Encounter: Payer: Self-pay | Admitting: Family

## 2023-08-29 MED ORDER — AMPHETAMINE-DEXTROAMPHETAMINE 5 MG PO TABS: 5.0000 mg | ORAL_TABLET | Freq: Every day | ORAL | 0 refills | Status: DC

## 2023-08-29 MED ORDER — AMPHETAMINE-DEXTROAMPHET ER 20 MG PO CP24: 20.0000 mg | ORAL_CAPSULE | ORAL | 0 refills | Status: DC

## 2023-08-29 NOTE — Telephone Encounter (Signed)
 Requesting: adderall Contract:02/14/23 UDS:02/14/23 Last Visit:08/17/23 Next Visit:10/12/23 Last Refill:07/27/23  Please Advise

## 2023-09-29 ENCOUNTER — Encounter: Payer: Self-pay | Admitting: Family

## 2023-09-29 MED ORDER — AMPHETAMINE-DEXTROAMPHET ER 20 MG PO CP24: 20.0000 mg | ORAL_CAPSULE | ORAL | 0 refills | Status: DC

## 2023-09-29 MED ORDER — AMPHETAMINE-DEXTROAMPHETAMINE 5 MG PO TABS: 5.0000 mg | ORAL_TABLET | Freq: Every day | ORAL | 0 refills | Status: DC

## 2023-09-29 NOTE — Telephone Encounter (Signed)
 Requesting: Adderall xr 20mg  and Adderall 5mg  Contract: 03/11/23 UDS: 02/14/23 Last Visit: 08/17/23 Next Visit: 02/17/24 Last Refill: see med list  Please Advise

## 2023-10-12 ENCOUNTER — Ambulatory Visit (INDEPENDENT_AMBULATORY_CARE_PROVIDER_SITE_OTHER)

## 2023-10-12 DIAGNOSIS — Z23 Encounter for immunization: Secondary | ICD-10-CM

## 2023-10-12 NOTE — Progress Notes (Signed)
 Pt here for 2nd Hep B per Melissa.  Pt received vaccine in left IM deltoid. Pt handled well.

## 2023-10-27 ENCOUNTER — Encounter: Payer: Self-pay | Admitting: Family

## 2023-10-27 DIAGNOSIS — F902 Attention-deficit hyperactivity disorder, combined type: Secondary | ICD-10-CM

## 2023-10-28 MED ORDER — AMPHETAMINE-DEXTROAMPHETAMINE 5 MG PO TABS
5.0000 mg | ORAL_TABLET | Freq: Every day | ORAL | 0 refills | Status: DC
Start: 2023-10-28 — End: 2023-11-29

## 2023-10-28 MED ORDER — AMPHETAMINE-DEXTROAMPHET ER 20 MG PO CP24
20.0000 mg | ORAL_CAPSULE | ORAL | 0 refills | Status: DC
Start: 2023-10-28 — End: 2023-11-29

## 2023-10-28 NOTE — Telephone Encounter (Signed)
 Requesting: Adderall XR 20mg  and Adderall 5mg   Contract: 02/14/23 UDS: 02/14/23 Last Visit: 08/17/23 Next Visit: 02/17/24 Last Refill: see med list   Please Advise

## 2023-11-01 ENCOUNTER — Other Ambulatory Visit: Payer: Self-pay | Admitting: *Deleted

## 2023-11-01 ENCOUNTER — Encounter: Payer: Self-pay | Admitting: Family

## 2023-11-01 MED ORDER — VALACYCLOVIR HCL 1 G PO TABS: ORAL_TABLET | ORAL | 1 refills | Status: DC

## 2023-11-29 ENCOUNTER — Encounter: Payer: Self-pay | Admitting: Family

## 2023-11-29 DIAGNOSIS — F902 Attention-deficit hyperactivity disorder, combined type: Secondary | ICD-10-CM

## 2023-11-29 MED ORDER — AMPHETAMINE-DEXTROAMPHET ER 20 MG PO CP24: 20.0000 mg | ORAL_CAPSULE | ORAL | 0 refills | Status: DC

## 2023-11-29 MED ORDER — AMPHETAMINE-DEXTROAMPHETAMINE 5 MG PO TABS: 5.0000 mg | ORAL_TABLET | Freq: Every day | ORAL | 0 refills | Status: DC

## 2023-12-24 ENCOUNTER — Other Ambulatory Visit: Payer: Self-pay | Admitting: Family

## 2024-01-03 ENCOUNTER — Encounter: Payer: Self-pay | Admitting: Family

## 2024-01-03 DIAGNOSIS — F902 Attention-deficit hyperactivity disorder, combined type: Secondary | ICD-10-CM

## 2024-01-03 MED ORDER — AMPHETAMINE-DEXTROAMPHET ER 20 MG PO CP24: 20.0000 mg | ORAL_CAPSULE | ORAL | 0 refills | Status: DC

## 2024-01-03 MED ORDER — AMPHETAMINE-DEXTROAMPHETAMINE 5 MG PO TABS: 5.0000 mg | ORAL_TABLET | Freq: Every day | ORAL | 0 refills | Status: DC

## 2024-01-03 NOTE — Telephone Encounter (Signed)
 Requesting: Adderall XR 20mg  and Adderall 5mg   Contract: 02/14/23 UDS: 02/14/23 Last Visit: 08/17/23 Next Visit: 02/17/24 Last Refill: see med list   Please Advise

## 2024-02-03 ENCOUNTER — Encounter: Payer: Self-pay | Admitting: Family

## 2024-02-03 DIAGNOSIS — F902 Attention-deficit hyperactivity disorder, combined type: Secondary | ICD-10-CM

## 2024-02-03 MED ORDER — AMPHETAMINE-DEXTROAMPHET ER 20 MG PO CP24: 20.0000 mg | ORAL_CAPSULE | ORAL | 0 refills | Status: DC

## 2024-02-03 MED ORDER — AMPHETAMINE-DEXTROAMPHETAMINE 5 MG PO TABS: 5.0000 mg | ORAL_TABLET | Freq: Every day | ORAL | 0 refills | Status: AC

## 2024-02-09 MED ORDER — AMPHETAMINE-DEXTROAMPHET ER 20 MG PO CP24: 20.0000 mg | ORAL_CAPSULE | ORAL | 0 refills | Status: AC

## 2024-02-09 NOTE — Telephone Encounter (Signed)
 Can you please cancel rx for Adderall at [facility] Tks.

## 2024-02-09 NOTE — Addendum Note (Signed)
 Addended by: DARYL SETTER on: 02/09/2024 11:21 AM   Modules accepted: Orders

## 2024-02-17 ENCOUNTER — Ambulatory Visit: Admitting: Family

## 2024-02-17 VITALS — BP 118/72 | HR 58 | Temp 97.9°F | Resp 16 | Ht 70.0 in | Wt 189.0 lb

## 2024-02-17 DIAGNOSIS — F902 Attention-deficit hyperactivity disorder, combined type: Secondary | ICD-10-CM

## 2024-02-17 DIAGNOSIS — F4323 Adjustment disorder with mixed anxiety and depressed mood: Secondary | ICD-10-CM | POA: Diagnosis not present

## 2024-02-17 DIAGNOSIS — Z23 Encounter for immunization: Secondary | ICD-10-CM | POA: Diagnosis not present

## 2024-02-17 NOTE — Progress Notes (Signed)
" ° °  Established Patient Office Visit  Subjective   Patient ID: Lawrence Mccarty, male    DOB: September 23, 1965  Age: 59 y.o. MRN: [id number]  Chief Complaint  Patient presents with   ADHD    Here for follow up last CSC and UDS 02/14/23   HPI  Patient presents to office today for Adderall prescription refill and to update controlled substance contract. Patient denies any other complaints and has no issues he would like to address. Patient reports ADHD manage well with current medication regime and would like to continue.  Review of Systems  Constitutional: Negative.   HENT: Negative.    Eyes: Negative.   Respiratory: Negative.    Cardiovascular: Negative.   Gastrointestinal: Negative.   Genitourinary: Negative.   Musculoskeletal: Negative.   Skin: Negative.   Neurological: Negative.   Endo/Heme/Allergies: Negative.   Psychiatric/Behavioral: Negative.       Objective:     BP 118/72 (BP Location: Right Arm, Patient Position: Sitting, Cuff Size: Normal)   Pulse (!) 58   Temp 97.9 F (36.6 C) (Oral)   Resp 16   Ht 5' 10 (1.778 m)   Wt 189 lb (85.7 kg)   SpO2 100%   BMI 27.12 kg/m    Physical Exam Vitals reviewed.  Constitutional:      Appearance: Normal appearance.  HENT:     Head: Normocephalic.  Cardiovascular:     Rate and Rhythm: Normal rate and regular rhythm.     Heart sounds: Normal heart sounds. No murmur heard.    No gallop.  Pulmonary:     Effort: Pulmonary effort is normal. No respiratory distress.     Breath sounds: Normal breath sounds. No stridor. No wheezing, rhonchi or rales.  Chest:     Chest wall: No tenderness.  Musculoskeletal:     Cervical back: Normal range of motion.  Neurological:     Mental Status: He is alert and oriented to person, place, and time.  Psychiatric:        Mood and Affect: Mood normal.        Behavior: Behavior normal.    The 10-year ASCVD risk score (Arnett DK, et al., 2019) is: 4.9%    Assessment & Plan:   ADHD -  controlled substance contract signed by patient and updated on file. Adderral prescriptions refilled   Urine drug screen  Flu shot given  Follow up in 6 months  Levon Almin [employer]-FNP student   "

## 2024-02-17 NOTE — Progress Notes (Signed)
 "  Subjective:     Patient ID: Lawrence Mccarty, male    DOB: 08-28-1965, 59 y.o.   MRN: [id number]  Chief Complaint  Patient presents with   ADHD    Here for follow up last CSC and UDS 02/14/23    HPI  Discussed the use of AI scribe software for clinical note transcription with the patient, who gave verbal consent to proceed.  History of Present Illness Lawrence Mccarty is a 59 year old male who presents for an update of his contract and refill of his Adderall prescription.  He is currently taking Adderall XR 20 mg in the morning and an additional 5 mg in the afternoon, which provides a boost around 2 to 3 PM. This regimen is effective for him, and he has good control of his attention with the current medication regimen.  He has no other complaints and no symptoms are present anywhere in his body.  His personal life is stable, with his wife and kids being happy. 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      Health Maintenance Due  Topic Date Due   COVID-19 Vaccine (3 - Pfizer risk series) 07/25/2019   Influenza Vaccine  09/02/2023    Past Medical History:  Diagnosis Date   Amplified musculoskeletal pain    Attention deficit hyperactivity disorder, combined type, moderate    Formal Evaluation 9/23 @ [facility]   Basal cell carcinoma 2004   nose   BASAL CELL CARCINOMA, HX OF 02/18/2010   Qualifier: Diagnosis of  By: Daryl FNP, Jermar Colter S    Benign essential hypertension    Chronic headaches    Facial injury age 25   chain saw accident   GERD (gastroesophageal reflux disease) 11/24/2012   Hand crush injury 08/31/2014   HYPERCHOLESTEROLEMIA 09/06/2007   Qualifier: Diagnosis of  By: Tanda MD, LauraLee     Hyperglycemia    Hyperlipidemia    Mass    right wrist ulnar   Mouth sore 09/22/2011   Osgood-Schlatter's disease    right knee   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   Qualifier: Diagnosis of  By: Tanda MD, LauraLee     Rectal bleeding  07/08/2010   Right-sided chest pain 08/31/2014   SHOULDER PAIN, LEFT 02/18/2010   Qualifier: Diagnosis of  By: Daryl FNP, Eleanor S     Past Surgical History:  Procedure Laterality Date   BASAL CELL CARCINOMA EXCISION  2004   nose   COLONOSCOPY  07/14/2010   diverticulosis and internal hemorrhoids   left foot surgery  08/2021   Pin placement following fracture   TONSILLECTOMY     WRIST SURGERY  05/2005   ulnar mass removed    Family History  Problem Relation Age of Onset   Hyperlipidemia Mother    Hyperlipidemia Father        mother   Hypertension Father    Brain cancer Father        glioblastoma   Colon polyps Sister    Cancer Maternal Grandfather        throat, no-smoker   CAD Paternal Grandfather    Colon cancer Neg Hx    Esophageal cancer Neg Hx    Rectal cancer Neg Hx    Stomach cancer Neg Hx     Social History   Socioeconomic History   Marital status: Married    Spouse name: Not on file   Number of children: 2   Years of education: Not  on file   Highest education level: Bachelor's degree (e.g., BA, AB, BS)  Occupational History   Occupation: MANAGER    Employer: [employer]  Tobacco Use   Smoking status: Never   Smokeless tobacco: Never  Vaping Use   Vaping status: Never Used  Substance and Sexual Activity   Alcohol use: Yes    Alcohol/week: 15.0 standard drinks of alcohol    Types: 15 Standard drinks or equivalent per week    Comment: beer, wine   Drug use: No   Sexual activity: Yes    Partners: Female  Other Topics Concern   Not on file  Social History Narrative   Regular exercise:   No   Ambulance Person   Daughter- age 72 (moving to [place])   Son age 9   Enjoys boating and golfing   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 in Business management   2 cats   Social Drivers of Health   Tobacco Use: Low Risk (08/17/2023)   Patient History    Smoking Tobacco Use: Never    Smokeless Tobacco Use: Never    Passive Exposure: Not on file  Financial  Resource Strain: Low Risk (02/16/2024)   Overall Financial Resource Strain (CARDIA)    Difficulty of Paying Living Expenses: Not hard at all  Food Insecurity: No Food Insecurity (02/16/2024)   Epic    Worried About Programme Researcher, Broadcasting/film/video in the Last Year: Never true    Ran Out of Food in the Last Year: Never true  Transportation Needs: No Transportation Needs (02/16/2024)   Epic    Lack of Transportation (Medical): No    Lack of Transportation (Non-Medical): No  Physical Activity: Sufficiently Active (02/16/2024)   Exercise Vital Sign    Days of Exercise per Week: 5 days    Minutes of Exercise per Session: 90 min  Stress: No Stress Concern Present (02/16/2024)   [employer] of Occupational Health - Occupational Stress Questionnaire    Feeling of Stress: Only a little  Social Connections: Moderately Integrated (02/16/2024)   Social Connection and Isolation Panel    Frequency of Communication with Friends and Family: More than three times a week    Frequency of Social Gatherings with Friends and Family: Three times a week    Attends Religious Services: 1 to 4 times per year    Active Member of Clubs or Organizations: No    Attends Banker Meetings: Not on file    Marital Status: Married  Catering Manager Violence: Not on file  Depression (PHQ2-9): Low Risk (02/17/2024)   Depression (PHQ2-9)    PHQ-2 Score: 1  Alcohol Screen: Low Risk (02/16/2024)   Alcohol Screen    Last Alcohol Screening Score (AUDIT): 3  Housing: Low Risk (02/16/2024)   Epic    Unable to Pay for Housing in the Last Year: No    Number of Times Moved in the Last Year: 0    Homeless in the Last Year: No  Utilities: Not on file  Health Literacy: Not on file    Outpatient Medications Prior to Visit  Medication Sig Dispense Refill   amphetamine -dextroamphetamine  (ADDERALL XR) 20 MG 24 hr capsule Take 1 capsule (20 mg total) by mouth every morning. 30 capsule 0   amphetamine -dextroamphetamine  (ADDERALL) 5  MG tablet Take 1 tablet (5 mg total) by mouth daily in the afternoon. 30 tablet 0   valACYclovir  (VALTREX ) 1000 MG tablet TAKE 1 TABLET BY MOUTH AT THE START OF COLD SORE AND REPEAT IN  12 HRS 10 tablet 1   No facility-administered medications prior to visit.    Allergies[1]  ROS See HPI    Objective:    Physical Exam Constitutional:      General: He is not in acute distress.    Appearance: He is well-developed.  HENT:     Head: Normocephalic and atraumatic.  Cardiovascular:     Rate and Rhythm: Normal rate and regular rhythm.     Heart sounds: No murmur heard. Pulmonary:     Effort: Pulmonary effort is normal. No respiratory distress.     Breath sounds: Normal breath sounds. No wheezing or rales.  Skin:    General: Skin is warm and dry.  Neurological:     Mental Status: He is alert and oriented to person, place, and time.  Psychiatric:        Behavior: Behavior normal.        Thought Content: Thought content normal.      BP 118/72 (BP Location: Right Arm, Patient Position: Sitting, Cuff Size: Normal)   Pulse (!) 58   Temp 97.9 F (36.6 C) (Oral)   Resp 16   Ht 5' 10 (1.778 m)   Wt 189 lb (85.7 kg)   SpO2 100%   BMI 27.12 kg/m  Wt Readings from Last 3 Encounters:  02/17/24 189 lb (85.7 kg)  08/17/23 185 lb 9.6 oz (84.2 kg)  02/14/23 186 lb (84.4 kg)       Assessment & Plan:   Problem List Items Addressed This Visit       Unprioritized   Attention deficit hyperactivity disorder, combined type, moderate - Primary   Relevant Orders   DRUG MONITORING, PANEL 8 WITH CONFIRMATION, URINE   Adjustment disorder with mixed anxiety and depressed mood    ADHD well-controlled with current medication. No attention issues reported. - Continue Adderall XR 20 mg orally every morning. - Continue Adderall 5 mg orally in the afternoon. - Ordered urine drug screen to monitor medication use. - Controlled substance contract is updated. - Scheduled follow-up in six  months.        Assessment & Plan  General Health Maintenance Due for annual influenza vaccination. - Administered influenza vaccine today.   I am having Lamar CHARLENA Lajuanda Octaviano maintain his valACYclovir , amphetamine -dextroamphetamine , and amphetamine -dextroamphetamine .  No orders of the defined types were placed in this encounter.     [1] No Known Allergies  "

## 2024-02-17 NOTE — Assessment & Plan Note (Signed)
" °  ADHD well-controlled with current medication. No attention issues reported. - Continue Adderall XR 20 mg orally every morning. - Continue Adderall 5 mg orally in the afternoon. - Ordered urine drug screen to monitor medication use. - Controlled substance contract is updated. - Scheduled follow-up in six months.  "

## 2024-02-17 NOTE — Patient Instructions (Signed)
" °  VISIT SUMMARY: Today, you came in for an update on your medication and to refill your Adderall prescription. You reported that your current medication regimen is effective and you have good control of your attention. You have no other complaints or symptoms, and your personal life is stable.  YOUR PLAN: -ATTENTION-DEFICIT HYPERACTIVITY DISORDER, COMBINED TYPE: Your ADHD is well-controlled with your current medication. ADHD is a condition that affects your ability to focus and control impulses. You should continue taking Adderall XR 20 mg every morning and Adderall 5 mg in the afternoon. A urine drug screen was ordered to monitor your medication use. We will schedule a follow-up appointment in six months.  -GENERAL HEALTH MAINTENANCE: You were due for your annual influenza vaccination, which was administered today. Keeping up with vaccinations is important for preventing illness.  INSTRUCTIONS: Please continue your current medication regimen and attend the follow-up appointment in six months. Additionally, ensure you keep up with any other recommended vaccinations and general health maintenance.                                          "

## 2024-02-19 LAB — DRUG MONITORING, PANEL 8 WITH CONFIRMATION, URINE
6 Acetylmorphine: NEGATIVE ng/mL
Alcohol Metabolites: POSITIVE ng/mL — AB
Amphetamines: NEGATIVE ng/mL
Benzodiazepines: NEGATIVE ng/mL
Buprenorphine, Urine: NEGATIVE ng/mL
Cocaine Metabolite: NEGATIVE ng/mL
Creatinine: 106.7 mg/dL
Ethyl Glucuronide (ETG): 6087 ng/mL — ABNORMAL HIGH
Ethyl Sulfate (ETS): 1770 ng/mL — ABNORMAL HIGH
MDMA: NEGATIVE ng/mL
Marijuana Metabolite: NEGATIVE ng/mL
Opiates: NEGATIVE ng/mL
Oxidant: NEGATIVE ug/mL
Oxycodone: NEGATIVE ng/mL
pH: 5.2 (ref 4.5–9.0)

## 2024-02-19 LAB — DM TEMPLATE

## 2024-08-17 ENCOUNTER — Ambulatory Visit: Admitting: Family
